# Patient Record
Sex: Female | Born: 1980 | Race: Black or African American | Hispanic: No | Smoking: Former smoker
Health system: Southern US, Community
[De-identification: ages and names within clinical notes are randomized; demographics above are authoritative.]

## PROBLEM LIST (undated history)

## (undated) DIAGNOSIS — F1210 Cannabis abuse, uncomplicated: Secondary | ICD-10-CM

## (undated) DIAGNOSIS — I10 Essential (primary) hypertension: Secondary | ICD-10-CM

## (undated) DIAGNOSIS — F1410 Cocaine abuse, uncomplicated: Secondary | ICD-10-CM

## (undated) DIAGNOSIS — E119 Type 2 diabetes mellitus without complications: Secondary | ICD-10-CM

## (undated) DIAGNOSIS — F1010 Alcohol abuse, uncomplicated: Secondary | ICD-10-CM

## (undated) HISTORY — DX: Essential (primary) hypertension: I10

---

## 2020-03-23 ENCOUNTER — Encounter (HOSPITAL_BASED_OUTPATIENT_CLINIC_OR_DEPARTMENT_OTHER): Payer: Self-pay

## 2020-03-23 ENCOUNTER — Other Ambulatory Visit: Payer: Self-pay

## 2020-03-23 ENCOUNTER — Emergency Department (HOSPITAL_BASED_OUTPATIENT_CLINIC_OR_DEPARTMENT_OTHER)
Admission: EM | Admit: 2020-03-23 | Discharge: 2020-03-23 | Disposition: A | Discharge status: Home / Self Care | Payer: Medicaid Other | Attending: Emergency Medicine | Admitting: Emergency Medicine

## 2020-03-23 DIAGNOSIS — F17210 Nicotine dependence, cigarettes, uncomplicated: Secondary | ICD-10-CM | POA: Diagnosis not present

## 2020-03-23 DIAGNOSIS — R102 Pelvic and perineal pain: Secondary | ICD-10-CM | POA: Diagnosis present

## 2020-03-23 DIAGNOSIS — N898 Other specified noninflammatory disorders of vagina: Secondary | ICD-10-CM | POA: Insufficient documentation

## 2020-03-23 HISTORY — DX: Cannabis abuse, uncomplicated: F12.10

## 2020-03-23 HISTORY — DX: Cocaine abuse, uncomplicated: F14.10

## 2020-03-23 HISTORY — DX: Alcohol abuse, uncomplicated: F10.10

## 2020-03-23 LAB — URINALYSIS, ROUTINE W REFLEX MICROSCOPIC
Bilirubin Urine: NEGATIVE
Glucose, UA: NEGATIVE mg/dL
Hgb urine dipstick: NEGATIVE
Ketones, ur: NEGATIVE mg/dL
Leukocytes,Ua: NEGATIVE
Nitrite: NEGATIVE
Protein, ur: NEGATIVE mg/dL
Specific Gravity, Urine: 1.025 (ref 1.005–1.030)
pH: 7 (ref 5.0–8.0)

## 2020-03-23 LAB — PREGNANCY, URINE: Preg Test, Ur: NEGATIVE

## 2020-03-23 NOTE — ED Notes (Signed)
Patient was having mild vaginal itching earlier, now resolved

## 2020-03-23 NOTE — ED Triage Notes (Addendum)
Pt c/o vaginal pain/irritation x today-states she is at [facility] x today-in rehab for polysubstance abuse-NAD-steady gait

## 2020-03-23 NOTE — ED Provider Notes (Signed)
[facility] EMERGENCY DEPARTMENT Provider Note   CSN: [id number] Arrival date & time: 03/23/20  1447     History Chief Complaint  Patient presents with  . Vaginal Pain    Allison Brown is a 39 y.o. female past [place] polysubstance abuse presents for evaluation of vaginal irritation.  She reports that this morning after drinking orange juice, she had a little bit of vaginal irritation.  She states that is since resolved.  She currently denies any symptoms.  She denies any dysuria, hematuria, vaginal discharge, vaginal bleeding, nausea/vomiting, abdominal pain.  She is currently sexually active with 1 partner.  They do not use protection.  Her LMP was a week ago.  The history is provided by the patient.       Past Medical History:  Diagnosis Date  . Cocaine abuse (HCC)   . ETOH abuse   . Marijuana abuse     There are no problems to display for this patient.    The histories are not reviewed yet. Please review them in the "History" navigator section and refresh this SmartLink.   OB History   No obstetric history on file.     No family history on file.  Social History   Tobacco Use  . Smoking status: Current Every Day Smoker    Types: Cigarettes  . Smokeless tobacco: Never Used  Substance Use Topics  . Alcohol use: Not Currently    Comment: in rehab  . Drug use: Not Currently    Comment: in rehab    Home Medications Prior to Admission medications   Not on File    Allergies    Hydrocodone  Review of Systems   Review of Systems  Constitutional: Negative for fever.  Gastrointestinal: Negative for abdominal pain, nausea and vomiting.  Genitourinary: Negative for pelvic pain, vaginal bleeding, vaginal discharge and vaginal pain.  All other systems reviewed and are negative.   Physical Exam Updated Vital Signs BP (!) 140/104   Pulse 84   Temp 98.8 F (37.1 C) (Oral)   Resp 18   Ht 5\' 3"  (1.6 m)   Wt 86.6 kg   LMP 03/19/2020   SpO2 100%    BMI 33.83 kg/m   Physical Exam Vitals and nursing note reviewed.  Constitutional:      Appearance: She is well-developed.  HENT:     Head: Normocephalic and atraumatic.  Eyes:     General: No scleral icterus.       Right eye: No discharge.        Left eye: No discharge.     Conjunctiva/sclera: Conjunctivae normal.  Pulmonary:     Effort: Pulmonary effort is normal.  Abdominal:     Comments: Abdomen is soft, non-distended, non-tender. No rigidity, No guarding. No peritoneal signs.  Skin:    General: Skin is warm and dry.  Neurological:     Mental Status: She is alert.  Psychiatric:        Speech: Speech normal.        Behavior: Behavior normal.     ED Results / Procedures / Treatments   Labs (all labs ordered are listed, but only abnormal results are displayed) Labs Reviewed  PREGNANCY, URINE  URINALYSIS, ROUTINE W REFLEX MICROSCOPIC    EKG None  Radiology No results found.  Procedures Procedures (including critical care time)  Medications Ordered in ED Medications - No data to display  ED Course  I have reviewed the triage vital signs and the nursing notes.  Pertinent labs & imaging results that were available during my care of the patient were reviewed by me and considered in my medical decision making (see chart for details).    MDM Rules/Calculators/A&P                          39 year old female who presents for evaluation of vaginal irritation.  She reports that after drinking orange juice earlier this morning, she had an episode where she had some vaginal irritation.  She states that is since resolved.  No vaginal pain, vaginal discharge, pelvic pain, abdominal pain, nausea/vomiting.  On initially arrival, she is afebrile, nontoxic-appearing.  Vital signs are stable.  She has a benign abdominal exam.  Urine ordered at triage.  Urine pregnancy is negative.  UA negative for any infectious etiology.  I discussed treatment options with patient in the  ED.  I offered for her to do a self swab, a full pelvic exam with testing, a visual evaluation with nurse chaperone.  After discussing all these options, patient opted to not pursue any further work-up here in the ED.  She states she is not symptomatic sounds like to be discharged.  Patient exhibits full medical decision-making capacity appears clinically sober. At this time, patient exhibits no emergent life-threatening condition that require further evaluation in ED. Patient had ample opportunity for questions and discussion. All patient's questions were answered with full understanding. Strict return precautions discussed. Patient expresses understanding and agreement to plan.   Portions of this note were generated with Scientist, clinical (histocompatibility and immunogenetics). Dictation errors may occur despite best attempts at proofreading.   Final Clinical Impression(s) / ED Diagnoses Final diagnoses:  Vaginal irritation    Rx / DC Orders ED Discharge Orders    None       Rosana Hoes 03/23/20 1834    Pollyann Savoy, MD 03/23/20 1840

## 2020-03-23 NOTE — Discharge Instructions (Signed)
Return to the Emergency Dept for any vaginal bleeding, vaginal pain, abdominal pain, fever, vomiting.

## 2020-04-29 ENCOUNTER — Other Ambulatory Visit: Payer: Self-pay

## 2020-04-29 ENCOUNTER — Telehealth (INDEPENDENT_AMBULATORY_CARE_PROVIDER_SITE_OTHER): Payer: Medicaid Other | Admitting: Physician Assistant

## 2020-04-29 DIAGNOSIS — F411 Generalized anxiety disorder: Secondary | ICD-10-CM | POA: Diagnosis not present

## 2020-04-29 DIAGNOSIS — F322 Major depressive disorder, single episode, severe without psychotic features: Secondary | ICD-10-CM | POA: Insufficient documentation

## 2020-04-29 DIAGNOSIS — F32A Depression, unspecified: Secondary | ICD-10-CM | POA: Insufficient documentation

## 2020-04-29 DIAGNOSIS — F331 Major depressive disorder, recurrent, moderate: Secondary | ICD-10-CM

## 2020-04-29 MED ORDER — HYDROXYZINE HCL 10 MG PO TABS: 10.0000 mg | ORAL_TABLET | Freq: Three times a day (TID) | ORAL | 0 refills | Status: DC | PRN

## 2020-04-29 MED ORDER — FLUOXETINE HCL 20 MG PO CAPS: 20.0000 mg | ORAL_CAPSULE | Freq: Every day | ORAL | 1 refills | Status: DC
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
## 2020-05-02 ENCOUNTER — Telehealth (HOSPITAL_COMMUNITY): Payer: Medicaid Other

## 2020-05-02 ENCOUNTER — Encounter (HOSPITAL_COMMUNITY): Payer: Self-pay | Admitting: Physician Assistant

## 2020-05-02 ENCOUNTER — Other Ambulatory Visit: Payer: Self-pay

## 2020-05-02 ENCOUNTER — Ambulatory Visit (HOSPITAL_COMMUNITY): Payer: Self-pay | Admitting: Licensed Clinical Social Worker

## 2020-06-10 ENCOUNTER — Telehealth (INDEPENDENT_AMBULATORY_CARE_PROVIDER_SITE_OTHER): Payer: Medicaid Other | Admitting: Physician Assistant

## 2020-06-10 ENCOUNTER — Other Ambulatory Visit: Payer: Self-pay

## 2020-06-10 ENCOUNTER — Encounter (HOSPITAL_COMMUNITY): Payer: Self-pay | Admitting: Physician Assistant

## 2020-06-10 DIAGNOSIS — F331 Major depressive disorder, recurrent, moderate: Secondary | ICD-10-CM | POA: Diagnosis not present

## 2020-06-10 DIAGNOSIS — F411 Generalized anxiety disorder: Secondary | ICD-10-CM | POA: Diagnosis not present

## 2020-06-10 MED ORDER — FLUOXETINE HCL 20 MG PO CAPS: 20.0000 mg | ORAL_CAPSULE | Freq: Every day | ORAL | 1 refills | Status: DC

## 2020-06-10 MED ORDER — HYDROXYZINE HCL 10 MG PO TABS: 10.0000 mg | ORAL_TABLET | Freq: Three times a day (TID) | ORAL | 1 refills | Status: DC | PRN

## 2020-06-10 NOTE — Progress Notes (Signed)
BH MD/PA/NP OP Progress Note  Virtual Visit via Telephone Note  I connected with Allison Brown on 06/10/20 at  3:00 PM EST by telephone and verified that I am speaking with the correct person using two identifiers.  Location: Patient: Home Provider: Clinic   I discussed the limitations, risks, security and privacy concerns of performing an evaluation and management service by telephone and the availability of in person appointments. I also discussed with the patient that there may be a patient responsible charge related to this service. The patient expressed understanding and agreed to proceed.  Follow Up Instructions:   I discussed the assessment and treatment plan with the patient. The patient was provided an opportunity to ask questions and all were answered. The patient agreed with the plan and demonstrated an understanding of the instructions.   The patient was advised to call back or seek an in-person evaluation if the symptoms worsen or if the condition fails to improve as anticipated.  I provided 22 minutes of non-face-to-face time during this encounter.  Allison Hatchet, PA   06/10/2020 3:25 PM Allison Brown  MRN:  [id number]  Chief Complaint: Follow-up and medication management  HPI:  Allison Brown is a 40 year old female with a past psychiatric history significant for generalized anxiety disorder and major depressive disorder who presents to [facility] for follow-up and medication management.  Patient is currently being managed on the following medications:  Hydroxyzine 10 mg 3 times daily as needed Prozac 20 mg daily  Patient reports no issues or concerns with her current medication regimen.  Patient denies experiencing any side effects from her current medications.  Patient denies the need for dosage adjustments at this time and states that her symptoms are well controlled.  Patient is requesting refills on her medications  following the conclusion of the encounter.  Patient rates her current anxiety a 6 out of 10.  A GAD-7 screen was performed with the patient scoring a one.  Patient is pleasant, calm, cooperative, and fully engaged in conversation during the encounter.  Patient reports that she is relaxed and genial.  Patient denies suicidal or homicidal ideations.  She further denies auditory or visual hallucinations.  Patient endorses good sleep and receives on average 8 hours of sleep each night.  Patient endorses good appetite and eats on average 3 meals per day.  Patient denies alcohol consumption, tobacco use, and illicit drug use.  Visit Diagnosis: No diagnosis found.  Past Psychiatric History:  Major depressive disorder Generalized anxiety disorder  Past Medical History:  Past Medical History:  Diagnosis Date  . Cocaine abuse (HCC)   . ETOH abuse   . Marijuana abuse    History reviewed. No pertinent surgical history.  Family Psychiatric History: Grandmother - Anxiety and depression  Family History: History reviewed. No pertinent family history.  Social History:  Social History   Socioeconomic History  . Marital status: Legally Separated    Spouse name: Not on file  . Number of children: Not on file  . Years of education: Not on file  . Highest education level: Not on file  Occupational History  . Not on file  Tobacco Use  . Smoking status: Current Every Day Smoker    Types: Cigarettes  . Smokeless tobacco: Never Used  Substance and Sexual Activity  . Alcohol use: Not Currently    Comment: in rehab  . Drug use: Not Currently    Comment: in rehab  . Sexual activity: Not  on file  Other Topics Concern  . Not on file  Social History Narrative  . Not on file   Social Determinants of Health   Financial Resource Strain: Not on file  Food Insecurity: Not on file  Transportation Needs: Not on file  Physical Activity: Not on file  Stress: Not on file  Social Connections: Not on  file    Allergies:  Allergies  Allergen Reactions  . Hydrocodone Itching    Metabolic Disorder Labs: No results found for: HGBA1C, MPG No results found for: PROLACTIN No results found for: CHOL, TRIG, HDL, CHOLHDL, VLDL, LDLCALC No results found for: TSH  Therapeutic Level Labs: No results found for: LITHIUM No results found for: VALPROATE No components found for:  CBMZ  Current Medications: Current Outpatient Medications  Medication Sig Dispense Refill  . FLUoxetine (PROZAC) 20 MG capsule Take 1 capsule (20 mg total) by mouth daily. 30 capsule 1  . hydrOXYzine (ATARAX/VISTARIL) 10 MG tablet Take 1 tablet (10 mg total) by mouth 3 (three) times daily as needed for anxiety. 90 tablet 0   No current facility-administered medications for this visit.     Musculoskeletal: Strength & Muscle Tone: Unable to assess due to telemedicine visit Gait & Station: Unable to assess due to telemedicine visit Patient leans: Unable to assess due to telemedicine visit  Psychiatric Specialty Exam: Review of Systems  Psychiatric/Behavioral: Negative for decreased concentration, dysphoric mood, hallucinations, self-injury, sleep disturbance and suicidal ideas. The patient is nervous/anxious. The patient is not hyperactive.     There were no vitals taken for this visit.There is no height or weight on file to calculate BMI.  General Appearance: Unable to assess due to telemedicine visit  Eye Contact:  Unable to assess due to telemedicine visit  Speech:  Clear and Coherent and Normal Rate  Volume:  Normal  Mood:  Anxious and Euthymic  Affect:  Appropriate and Congruent  Thought Process:  Coherent, Goal Directed and Descriptions of Associations: Intact  Orientation:  Full (Time, Place, and Person)  Thought Content: WDL and Logical   Suicidal Thoughts:  No  Homicidal Thoughts:  No  Memory:  Immediate;   Good Recent;   Good Remote;   Good  Judgement:  Good  Insight:  Fair  Psychomotor  Activity:  Normal  Concentration:  Concentration: Good and Attention Span: Good  Recall:  Good  Fund of Knowledge: Good  Language: Good  Akathisia:  NA  Handed:  Right  AIMS (if indicated): not done  Assets:  Communication Skills Desire for Improvement Housing  ADL's:  Intact  Cognition: WNL  Sleep:  Good   Screenings: GAD-7   Flowsheet Row Video Visit from 06/10/2020 in [facility] Video Visit from 04/29/2020 in [facility]  Total GAD-7 Score 1 11    PHQ2-9   Flowsheet Row Video Visit from 06/10/2020 in [facility] Video Visit from 04/29/2020 in [facility]  PHQ-2 Total Score 0 6  PHQ-9 Total Score -- 18    Flowsheet Row Video Visit from 06/10/2020 in [facility]  C-SSRS RISK CATEGORY No Risk      Assessment and Plan:   Allison Brown is a 40 year old female with a past psychiatric history significant for generalized anxiety disorder and major depressive disorder who presents to [facility] for follow-up and medication management.  Patient reports no issues or concerns with her current medication regimen.  Patient denies the need for dosage adjustments at this time and states that her symptoms are well managed.  Patient is requesting refills on her medications following the conclusion of the encounter.  Patient's medications will be e-prescribed to pharmacy of choice.  1. Generalized anxiety disorder  - hydrOXYzine (ATARAX/VISTARIL) 10 MG tablet; Take 1 tablet (10 mg total) by mouth 3 (three) times daily as needed for anxiety.  Dispense: 90 tablet; Refill: 1 - FLUoxetine (PROZAC) 20 MG capsule; Take 1 capsule (20 mg total) by mouth daily.  Dispense: 30 capsule; Refill: 1  2. Moderate episode of recurrent major depressive disorder (HCC)  - FLUoxetine (PROZAC) 20 MG capsule; Take 1 capsule (20 mg  total) by mouth daily.  Dispense: 30 capsule; Refill: 1  Patient to follow up in 6 weeks  Allison Hatchet, PA 06/10/2020, 3:25 PM

## 2020-06-13 ENCOUNTER — Encounter: Payer: Medicaid Other | Admitting: Obstetrics and Gynecology

## 2020-06-14 ENCOUNTER — Telehealth: Payer: Self-pay

## 2020-06-14 NOTE — Telephone Encounter (Signed)
Pt needs a call to reschedule her appointment

## 2020-06-14 NOTE — Telephone Encounter (Signed)
Patient has been rescheduled for 06/17/20

## 2020-06-17 ENCOUNTER — Encounter: Payer: Medicaid Other | Admitting: Obstetrics and Gynecology

## 2020-06-20 ENCOUNTER — Other Ambulatory Visit (HOSPITAL_COMMUNITY)
Admission: RE | Admit: 2020-06-20 | Discharge: 2020-06-20 | Disposition: A | Discharge status: Home / Self Care | Payer: Medicaid Other | Source: Ambulatory Visit | Attending: Obstetrics and Gynecology | Admitting: Obstetrics and Gynecology

## 2020-06-20 ENCOUNTER — Ambulatory Visit (INDEPENDENT_AMBULATORY_CARE_PROVIDER_SITE_OTHER): Payer: Medicaid Other | Admitting: Obstetrics and Gynecology

## 2020-06-20 ENCOUNTER — Encounter: Payer: Self-pay | Admitting: Obstetrics and Gynecology

## 2020-06-20 ENCOUNTER — Other Ambulatory Visit: Payer: Self-pay

## 2020-06-20 VITALS — BP 138/90 | Ht 63.0 in | Wt 197.0 lb

## 2020-06-20 DIAGNOSIS — Z0000 Encounter for general adult medical examination without abnormal findings: Secondary | ICD-10-CM | POA: Diagnosis not present

## 2020-06-20 DIAGNOSIS — Z124 Encounter for screening for malignant neoplasm of cervix: Secondary | ICD-10-CM | POA: Insufficient documentation

## 2020-06-20 DIAGNOSIS — Z3009 Encounter for other general counseling and advice on contraception: Secondary | ICD-10-CM

## 2020-06-20 DIAGNOSIS — Z7185 Encounter for immunization safety counseling: Secondary | ICD-10-CM | POA: Diagnosis not present

## 2020-06-20 DIAGNOSIS — Z30015 Encounter for initial prescription of vaginal ring hormonal contraceptive: Secondary | ICD-10-CM

## 2020-06-20 DIAGNOSIS — Z113 Encounter for screening for infections with a predominantly sexual mode of transmission: Secondary | ICD-10-CM | POA: Insufficient documentation

## 2020-06-20 DIAGNOSIS — Z1159 Encounter for screening for other viral diseases: Secondary | ICD-10-CM

## 2020-06-20 DIAGNOSIS — Z01419 Encounter for gynecological examination (general) (routine) without abnormal findings: Secondary | ICD-10-CM

## 2020-06-20 MED ORDER — ETONOGESTREL-ETHINYL ESTRADIOL 0.12-0.015 MG/24HR VA RING: VAGINAL_RING | VAGINAL | 4 refills | Status: DC

## 2020-06-20 NOTE — Progress Notes (Signed)
Gynecology Annual Exam   PCP: Allison Brown, No Pcp Per  Chief Complaint:  Chief Complaint  Allison Brown presents with   Annual Exam    History of Present Illness: Allison Brown is a 40 y.o. [name] presents for annual exam. The Allison Brown has no complaints today.   LMP: Allison Brown's last menstrual period was 06/14/2020. Average Interval: regular, 28 days Duration of flow: 5 days Heavy Menses: yes Clots: no Intermenstrual Bleeding: no Postcoital Bleeding: no Dysmenorrhea: yes  The Allison Brown is sexually active. She currently uses nothing for contraception. She denies dyspareunia.  There is no notable family history of breast or ovarian cancer in her family.  The Allison Brown wears seatbelts: yes.   The Allison Brown has regular exercise: yes - she reports an "active" lifestyle  The Allison Brown denies current symptoms of depression.    Review of Systems: Review of Systems  Constitutional: Negative.   HENT: Negative.   Eyes: Negative.   Respiratory: Negative.   Cardiovascular: Negative.   Gastrointestinal: Negative.   Genitourinary: Negative.   Musculoskeletal: Negative.   Skin: Negative.   Neurological: Negative.   Endo/Heme/Allergies: Negative.     Past Medical History:  Allison Brown Active Problem List   Diagnosis Date Noted   Generalized anxiety disorder 04/29/2020   Moderately severe depression (HCC) 04/29/2020   Moderate episode of recurrent major depressive disorder (HCC) 04/29/2020    Past Surgical History:  Past Surgical History:  Procedure Laterality Date   CESAREAN SECTION      Gynecologic History:  Allison Brown's last menstrual period was 06/14/2020. Contraception: none Last Pap: Allison Brown is unsure  Obstetric History: [name]  Family History:  History reviewed. No pertinent family history.  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 on file  Substance and Sexual Activity   Alcohol use: Not Currently    Comment: in rehab   Drug use: Not Currently    Comment: in rehab   Sexual activity: Yes    Birth control/protection: Condom, None  Other Topics Concern   Not on file  Social History Narrative   Not on file   Social Determinants of Health   Financial Resource Strain: Not on file  Food Insecurity: Not on file  Transportation Needs: Not on file  Physical Activity: Not on file  Stress: Not on file  Social Connections: Not on file  Intimate Partner Violence: Not on file    Allergies:  Allergies  Allergen Reactions   Hydrocodone Itching    Medications: Prior to Admission medications   Medication Sig Start Date End Date Taking? Authorizing Provider  etonogestrel-ethinyl estradiol (NUVARING) 0.12-0.015 MG/24HR vaginal ring Insert vaginally and leave in place for 3 consecutive weeks, then remove for 1 week. 06/20/20  Yes Carnelius Hammitt, CNM  FLUoxetine (PROZAC) 20 MG capsule Take 1 capsule (20 mg total) by mouth daily. 06/10/20 06/10/21 Yes Nwoko, Uchenna E, PA  hydrOXYzine (ATARAX/VISTARIL) 10 MG tablet Take 1 tablet (10 mg total) by mouth 3 (three) times daily as needed for anxiety. 06/10/20  Yes Meta Hatchet, PA    Physical Exam Vitals:  Blood pressure 138/90, height 5\' 3"  (1.6 m), weight 197 lb (89.4 kg), last menstrual period 06/14/2020.  General: NAD HEENT: normocephalic, anicteric Thyroid: no enlargement, no palpable nodules Pulmonary: No increased work of breathing, CTAB Cardiovascular: RRR, distal pulses 2+ Breast: Breast symmetrical, no tenderness, no palpable nodules or masses, no skin or nipple retraction present, no nipple discharge.  No axillary or supraclavicular  lymphadenopathy. Abdomen: NABS, soft, non-tender, non-distended.  Umbilicus without lesions.  No hepatomegaly, splenomegaly or masses palpable. No evidence of hernia  Genitourinary:  External: Normal external female genitalia.  Normal urethral meatus, normal Bartholin's and Skene's glands.    Vagina: Normal vaginal mucosa, no evidence of prolapse.  Small amount of blood noted in vaginal vault. Of note - during exam several cotton balls (8-10) and two tampons were removed from vaginal vault.   Cervix: Grossly normal in appearance, no bleeding  Uterus: Non-enlarged, mobile, normal contour.  No CMT  Adnexa: ovaries non-enlarged, no adnexal masses  Rectal: deferred  Lymphatic: no evidence of inguinal lymphadenopathy Extremities: no edema, erythema, or tenderness Neurologic: Grossly intact Psychiatric: mood appropriate, affect full  Assessment: 40 y.o. 20 routine annual exam, desires contraception.  Plan: Problem List Items Addressed This Visit   None   Visit Diagnoses    Screening for cervical cancer    -  Primary   Relevant Orders   Cytology - PAP (Completed)   Screen for sexually transmitted diseases       Relevant Orders   Cytology - PAP (Completed)   HIV Antibody (routine testing w rflx) (Completed)   RPR Qual (Completed)   Hepatitis B surface antigen (Completed)   Immunization counseling       Routine health maintenance       Encounter for gynecological examination without abnormal finding       Encounter for counseling regarding contraception       Encounter for initial prescription of vaginal ring hormonal contraceptive       Need for hepatitis C screening test       Relevant Orders   Hepatitis C antibody (Completed)      1) STI screening  wasoffered and accepted  2)  ASCCP guidelines and rational discussed.  Allison Brown opts for every 3 years screening interval  3) Contraception - the Allison Brown is currently using nothing.  Allison Brown desires prescription for contraception  today. All contraception options were reviewed including IUDs, implants, injections, and COCs. Allison Brown desires to proceed with vaginal ring at this time.   4) Routine healthcare maintenance including cholesterol, diabetes screening discussed Allison Brown scheduled to establish care with a PCP - will follow-up regarding these labs at that visit  5) Return in about 1 year (around 06/20/2021).   08/20/2021, CNM, MSN [facility], [facility] 06/23/2020, 10:59 AM

## 2020-06-21 LAB — RPR QUALITATIVE: RPR Ser Ql: NONREACTIVE

## 2020-06-21 LAB — HIV ANTIBODY (ROUTINE TESTING W REFLEX): HIV Screen 4th Generation wRfx: NONREACTIVE

## 2020-06-21 LAB — CYTOLOGY - PAP
Adequacy: ABSENT
Chlamydia: NEGATIVE
Comment: NEGATIVE
Comment: NEGATIVE
Comment: NORMAL
Diagnosis: NEGATIVE
High risk HPV: NEGATIVE
Neisseria Gonorrhea: NEGATIVE

## 2020-06-21 LAB — HEPATITIS C ANTIBODY: Hep C Virus Ab: <0.1 s/co ratio (ref 0.0–0.9)

## 2020-06-21 LAB — HEPATITIS B SURFACE ANTIGEN: Hepatitis B Surface Ag: NEGATIVE

## 2020-06-23 ENCOUNTER — Other Ambulatory Visit: Payer: Self-pay | Admitting: Obstetrics and Gynecology

## 2020-06-23 DIAGNOSIS — Z30013 Encounter for initial prescription of injectable contraceptive: Secondary | ICD-10-CM

## 2020-06-23 MED ORDER — MEDROXYPROGESTERONE ACETATE 150 MG/ML IM SUSP: 150.0000 mg | INTRAMUSCULAR | 3 refills | Status: AC

## 2020-06-23 NOTE — Progress Notes (Signed)
Spoke with patient via phone. Utilized two patient identifiers. Reviewed labs collected on 06/20/20. All within normal limits. Discussed review of patient's chart and history of elevated blood pressures. Discussed prescription of vaginal ring COC for contraception and risks related to hx of elevated blood pressures. Made recommendation to change contraception. Patient desires depo injection. Ordered. Patient instructed to fill rx and bring to office for injection.  Zipporah Plants, CNM, MSN  [place], [place] 06/23/2020 08:57 AM

## 2020-06-28 ENCOUNTER — Encounter: Payer: Self-pay | Admitting: Family Medicine

## 2020-06-28 ENCOUNTER — Other Ambulatory Visit: Payer: Self-pay

## 2020-06-28 ENCOUNTER — Ambulatory Visit (INDEPENDENT_AMBULATORY_CARE_PROVIDER_SITE_OTHER): Payer: Medicaid Other | Admitting: Family Medicine

## 2020-06-28 ENCOUNTER — Ambulatory Visit: Payer: Self-pay | Admitting: Family Medicine

## 2020-06-28 ENCOUNTER — Telehealth: Payer: Self-pay

## 2020-06-28 ENCOUNTER — Ambulatory Visit
Admission: RE | Admit: 2020-06-28 | Discharge: 2020-06-28 | Disposition: A | Discharge status: Home / Self Care | Payer: Medicaid Other | Attending: Family Medicine | Admitting: Family Medicine

## 2020-06-28 ENCOUNTER — Ambulatory Visit
Admission: RE | Admit: 2020-06-28 | Discharge: 2020-06-28 | Disposition: A | Discharge status: Home / Self Care | Payer: Medicaid Other | Source: Ambulatory Visit | Attending: Family Medicine | Admitting: Family Medicine

## 2020-06-28 VITALS — BP 142/100 | HR 72 | Temp 97.8°F | Ht 63.0 in | Wt 199.0 lb

## 2020-06-28 DIAGNOSIS — Z6835 Body mass index (BMI) 35.0-35.9, adult: Secondary | ICD-10-CM

## 2020-06-28 DIAGNOSIS — M542 Cervicalgia: Secondary | ICD-10-CM

## 2020-06-28 DIAGNOSIS — H6121 Impacted cerumen, right ear: Secondary | ICD-10-CM | POA: Diagnosis not present

## 2020-06-28 DIAGNOSIS — F411 Generalized anxiety disorder: Secondary | ICD-10-CM

## 2020-06-28 DIAGNOSIS — R202 Paresthesia of skin: Secondary | ICD-10-CM

## 2020-06-28 DIAGNOSIS — F32A Depression, unspecified: Secondary | ICD-10-CM

## 2020-06-28 DIAGNOSIS — R030 Elevated blood-pressure reading, without diagnosis of hypertension: Secondary | ICD-10-CM | POA: Diagnosis not present

## 2020-06-28 DIAGNOSIS — Z0000 Encounter for general adult medical examination without abnormal findings: Secondary | ICD-10-CM

## 2020-06-28 DIAGNOSIS — F322 Major depressive disorder, single episode, severe without psychotic features: Secondary | ICD-10-CM

## 2020-06-28 NOTE — Assessment & Plan Note (Signed)
Patient had recently been seen by OB/GYN who she chooses to follow-up for related needs, they have also ordered appropriate studies. Remaining pertinent labs were ordered and will be reviewed at her follow-up

## 2020-06-28 NOTE — Telephone Encounter (Signed)
Copied from CRM [id number]. Topic: General - Other >> Jun 28, 2020 12:18 PM Gaetana Michaelis A wrote: Reason for CRM: Patient has been notified by staff of additional labwork needed  Patient would like to be contacted to discuss further, as well as alternative locations of testing   Please contact to advise further

## 2020-06-28 NOTE — Assessment & Plan Note (Signed)
Focal symptoms which were addressed with lavage providing prompt resolution of symptoms expressed by patient. Routine care advised, she can follow-up as-needed for this if recurrent.

## 2020-06-28 NOTE — Assessment & Plan Note (Signed)
Appropriate risk stratification labs ordered, importance of diet, exercise reviewed. We will review labs and modify plan at return.

## 2020-06-28 NOTE — Assessment & Plan Note (Signed)
Patient is stable on her current regimen and sees outside mental health group who she is comfortable with to manage this issue longitudinally

## 2020-06-28 NOTE — Assessment & Plan Note (Signed)
Patient with elevated blood pressure on serial measurement today. I discussed pharmacotherapy and she is not amenable to initiation today, we will order risk stratification labs and I have advised her on DASH diet, exercise, and further lifestyle modifications over the interim.   Pending labs and BP reading at return, can revisit pharmacotherapy options.

## 2020-06-28 NOTE — Telephone Encounter (Signed)
Spoke to pt let her know that she could go to any [facility]. Told pt to bring her I.D when she goes to get her labs done. Pt verbalized understanding.  KP

## 2020-06-28 NOTE — Telephone Encounter (Signed)
Erroneous

## 2020-06-28 NOTE — Progress Notes (Signed)
New Patient Office Visit  Subjective:  Patient ID: Allison Brown, female    DOB: 12-27-1980  Age: 40 y.o. MRN: [id number]  CC:  Chief Complaint  Patient presents with  . Establish Care  . Neck Pain    HPI Allison Brown presents for an establishment of care visit as well as to address recent issues.  She states that she has had a few days history of lower neck/upper back pain described as an ache with radiation circumferentially as well as associated intermittent paresthesias in her right upper extremity to the fingertips, denies weakness, no trauma and woke up with the symptoms yesterday.  These were exacerbated by neck and upper body motion, partially alleviated with Excedrin, time, and states that symptoms are still present though milder today. Denies chest pain, palpitations, SOA, trauma, or prior episodes of the same.   She also brings up right ear pain for a few weeks, feels that there is "water on the ear" and has tried OTC Debrox without response, no fevers, chills, sick contacts, drainage.   Past Medical History:  Diagnosis Date  . Cocaine abuse (HCC)   . ETOH abuse   . Hypertension   . Marijuana abuse     Past Surgical History:  Procedure Laterality Date  . CESAREAN SECTION      History reviewed. No pertinent family history.  Social History   Socioeconomic History  . Marital status: Legally Separated    Spouse name: Not on file  . Number of children: 4  . Years of education: Not on file  . Highest education level: Not on file  Occupational History  . Not on file  Tobacco Use  . Smoking status: Former Smoker    Types: Cigarettes    Quit date: 03/20/2020    Years since quitting: 0.2  . Smokeless tobacco: Never Used  Vaping Use  . Vaping Use: Never used  Substance and Sexual Activity  . Alcohol use: Not Currently    Comment: in rehab  . Drug use: Not Currently    Comment: in rehab  . Sexual activity: Yes    Birth control/protection: Condom, None   Other Topics Concern  . Not on file  Social History Narrative  . Not on file   Social Determinants of Health   Financial Resource Strain: Not on file  Food Insecurity: Not on file  Transportation Needs: Not on file  Physical Activity: Not on file  Stress: Not on file  Social Connections: Not on file  Intimate Partner Violence: Not on file    ROS Review of Systems  Constitutional: Negative for chills and fever.  HENT: Positive for ear pain. Negative for ear discharge and rhinorrhea.   Respiratory: Negative for cough, chest tightness and shortness of breath.   Cardiovascular: Negative for chest pain and palpitations.  Gastrointestinal: Negative for abdominal pain.  Musculoskeletal: Positive for neck pain and neck stiffness.  Skin: Negative for rash.  Neurological: Positive for numbness. Negative for headaches.    Objective:   Today's Vitals: BP (!) 142/100   Pulse 72   Temp 97.8 F (36.6 C) (Oral)   Ht 5\' 3"  (1.6 m)   Wt 199 lb (90.3 kg)   LMP 06/14/2020   SpO2 99%   BMI 35.25 kg/m   Physical Exam  General: Well Developed, well nourished, and in no acute distress.  Neuro: Alert and oriented x3, extra-ocular muscles intact, sensation grossly intact. Cranial nerves II through XII are intact, motor, sensory, and coordinative functions  are all intact. HEENT: Normocephalic, atraumatic, pupils equal round reactive to light, neck supple, no masses, no lymphadenopathy, thyroid nonpalpable. Oropharynx, nasopharynx, external ear canals are unremarkable. Left TM benign, Right TM with dark impacted cerumen, canal benign; post-irrigation TM visualized and benign Skin: Warm and dry, no rashes noted.  Cardiac: Regular rate and rhythm, no murmurs rubs or gallops.  Respiratory: Clear to auscultation bilaterally. Not using accessory muscles, speaking in full sentences.  Abdominal: Soft, nontender, nondistended, positive bowel sounds, no masses, no organomegaly.  Musculoskeletal:  Shoulder, elbow, wrist, hip, knee, ankle stable, and with full range of motion.  Neck: Inspection unremarkable. No palpable stepoffs. Equivocal Spurling's maneuver Right. Full neck range of motion, painful rightward torsion and extension Grip strength and sensation grossly normal in bilateral hands Strength good C4 to T1 distribution No sensory change to C4 to T1 Negative Hoffman sign bilaterally Reflexes normal  Procedure: Cerumen lavage Indication: Cerumen impaction of the ear(s)  Medical necessity statement: On physical examination, cerumen impairs clinically significant portions of the external auditory canal, and tympanic membrane. Noted obstructive, copious cerumen that cannot be removed without magnification and instrumentations requiring physician skills Consent: Discussed benefits and risks of procedure and verbal consent obtained Procedure: Patient was prepped for the procedure. Utilized an otoscope to assess and take note of the ear canal, the tympanic membrane, and the presence, amount, and placement of the cerumen. Gentle water irrigation was utilized to remove cerumen.  Post procedure examination: shows cerumen was completely removed. Patient tolerated procedure well with endorsed significant symptomatologic improvement. The patient is made aware that they may experience temporary vertigo, temporary hearing loss, and temporary discomfort. If these symptom last for more than 24 hours to call the clinic or proceed to the ED.   Assessment & Plan:   Problem List Items Addressed This Visit      Nervous and Auditory   Impacted cerumen of right ear    Focal symptoms which were addressed with lavage providing prompt resolution of symptoms expressed by patient. Routine care advised, she can follow-up as-needed for this if recurrent.         Other   Moderately severe depression (HCC)    Patient is stable on her current regimen and sees outside mental health group who she is  comfortable with to manage this issue longitudinally      Routine general medical examination at a health care facility    Patient had recently been seen by OB/GYN who she chooses to follow-up for related needs, they have also ordered appropriate studies. Remaining pertinent labs were ordered and will be reviewed at her follow-up      Relevant Orders   CBC With Differential   Comprehensive metabolic panel   Paresthesia of right upper extremity    Atraumatic cervicalgia with associated right upper extremity paresthesias, examination overall reassuring. Primary symptoms upper trapezius though due to neurologic involvement, cervical spine will be further evaluated with plain films. I have also advised moist heat, gentle activity, and PRN Pennsaid twice daily to clean and dry skin (sample provided).  Pending x-rays and response to treatments, if suboptimal, can escalate pharmacotherapy, consider home-based versus formal PT as appropriate.      Relevant Orders   DG Cervical Spine Complete   Cervicalgia   Relevant Orders   DG Cervical Spine Complete   Elevated blood pressure reading    Patient with elevated blood pressure on serial measurement today. I discussed pharmacotherapy and she is not amenable to initiation today, we  will order risk stratification labs and I have advised her on DASH diet, exercise, and further lifestyle modifications over the interim.   Pending labs and BP reading at return, can revisit pharmacotherapy options.      Body mass index (BMI) of 35.0-35.9 in adult - Primary    Appropriate risk stratification labs ordered, importance of diet, exercise reviewed. We will review labs and modify plan at return.       Relevant Orders   Hemoglobin A1c   Lipid panel   TSH      Outpatient Encounter Medications as of 06/28/2020  Medication Sig  . FLUoxetine (PROZAC) 20 MG capsule Take 1 capsule (20 mg total) by mouth daily.  . hydrOXYzine (ATARAX/VISTARIL) 10 MG tablet  Take 1 tablet (10 mg total) by mouth 3 (three) times daily as needed for anxiety.  . medroxyPROGESTERone (DEPO-PROVERA) 150 MG/ML injection Inject 1 mL (150 mg total) into the muscle every 3 (three) months.   No facility-administered encounter medications on file as of 06/28/2020.    Follow-up: Return in about 2 weeks (around 07/12/2020) for f/u neck pain, labs.   Jerrol Banana, MD

## 2020-06-28 NOTE — Assessment & Plan Note (Signed)
Atraumatic cervicalgia with associated right upper extremity paresthesias, examination overall reassuring. Primary symptoms upper trapezius though due to neurologic involvement, cervical spine will be further evaluated with plain films. I have also advised moist heat, gentle activity, and PRN Pennsaid twice daily to clean and dry skin (sample provided).  Pending x-rays and response to treatments, if suboptimal, can escalate pharmacotherapy, consider home-based versus formal PT as appropriate.

## 2020-06-29 ENCOUNTER — Telehealth: Payer: Self-pay

## 2020-06-29 LAB — COMPREHENSIVE METABOLIC PANEL
ALT: 16 IU/L (ref 0–32)
AST: 18 IU/L (ref 0–40)
Albumin/Globulin Ratio: 1.6 (ref 1.2–2.2)
Albumin: 4.1 g/dL (ref 3.8–4.8)
Alkaline Phosphatase: 99 IU/L (ref 44–121)
BUN/Creatinine Ratio: 8 — ABNORMAL LOW (ref 9–23)
BUN: 6 mg/dL (ref 6–20)
Bilirubin Total: <0.2 mg/dL (ref 0.0–1.2)
CO2: 21 mmol/L (ref 20–29)
Calcium: 9.1 mg/dL (ref 8.7–10.2)
Chloride: 103 mmol/L (ref 96–106)
Creatinine, Ser: 0.71 mg/dL (ref 0.57–1.00)
Globulin, Total: 2.6 g/dL (ref 1.5–4.5)
Glucose: 106 mg/dL — ABNORMAL HIGH (ref 65–99)
Potassium: 4.3 mmol/L (ref 3.5–5.2)
Sodium: 138 mmol/L (ref 134–144)
Total Protein: 6.7 g/dL (ref 6.0–8.5)
eGFR: 112 mL/min/{1.73_m2} (ref >59)

## 2020-06-29 LAB — CBC WITH DIFFERENTIAL
Basophils Absolute: 0.1 10*3/uL (ref 0.0–0.2)
Basos: 1 %
EOS (ABSOLUTE): 0.1 10*3/uL (ref 0.0–0.4)
Eos: 1 %
Hematocrit: 37.7 % (ref 34.0–46.6)
Hemoglobin: 12.3 g/dL (ref 11.1–15.9)
Immature Grans (Abs): 0 10*3/uL (ref 0.0–0.1)
Immature Granulocytes: 0 %
Lymphocytes Absolute: 2.7 10*3/uL (ref 0.7–3.1)
Lymphs: 36 %
MCH: 27.3 pg (ref 26.6–33.0)
MCHC: 32.6 g/dL (ref 31.5–35.7)
MCV: 84 fL (ref 79–97)
Monocytes Absolute: 0.7 10*3/uL (ref 0.1–0.9)
Monocytes: 9 %
Neutrophils Absolute: 3.8 10*3/uL (ref 1.4–7.0)
Neutrophils: 53 %
RBC: 4.51 x10E6/uL (ref 3.77–5.28)
RDW: 13.1 % (ref 11.7–15.4)
WBC: 7.3 10*3/uL (ref 3.4–10.8)

## 2020-06-29 NOTE — Telephone Encounter (Signed)
Patient is scheduled for 07/01/20

## 2020-06-29 NOTE — Telephone Encounter (Signed)
-----   Message from Zipporah Plants, [place] sent at 06/23/2020  8:59 AM EST ----- Regarding: Depo Nurse Visit Could we call and get this patient scheduled for a nurse visit for her depo? Thanks, Jae Dire

## 2020-07-01 ENCOUNTER — Ambulatory Visit: Payer: Medicaid Other

## 2020-07-04 ENCOUNTER — Telehealth: Payer: Self-pay | Admitting: Family Medicine

## 2020-07-04 NOTE — Telephone Encounter (Signed)
Please give a call to the patient to let her know she still needs to go get a few more labs that are pending. Thanks

## 2020-07-04 NOTE — Telephone Encounter (Signed)
Spoke with patient who states she was unaware there were labs still pending to be collected.  Per patient will come in [place] for fasting labs.  For your information.

## 2020-07-05 ENCOUNTER — Other Ambulatory Visit: Payer: Self-pay

## 2020-07-05 ENCOUNTER — Ambulatory Visit (INDEPENDENT_AMBULATORY_CARE_PROVIDER_SITE_OTHER): Payer: Medicaid Other

## 2020-07-05 DIAGNOSIS — Z3042 Encounter for surveillance of injectable contraceptive: Secondary | ICD-10-CM | POA: Diagnosis not present

## 2020-07-05 MED ORDER — MEDROXYPROGESTERONE ACETATE 150 MG/ML IM SUSP
150.0000 mg | Freq: Once | INTRAMUSCULAR | Status: AC
  Administered 2020-07-05: 150 mg via INTRAMUSCULAR

## 2020-07-05 NOTE — Progress Notes (Signed)
Patient presents today for initial Depo Provera injection. LMP 07/03/20. Given IM RUOQ. Patient tolerated well.

## 2020-07-06 LAB — LIPID PANEL
Chol/HDL Ratio: 5.1 ratio — ABNORMAL HIGH (ref 0.0–4.4)
Cholesterol, Total: 178 mg/dL (ref 100–199)
HDL: 35 mg/dL — ABNORMAL LOW (ref >39)
LDL Chol Calc (NIH): 116 mg/dL — ABNORMAL HIGH (ref 0–99)
Triglycerides: 150 mg/dL — ABNORMAL HIGH (ref 0–149)
VLDL Cholesterol Cal: 27 mg/dL (ref 5–40)

## 2020-07-06 LAB — HEMOGLOBIN A1C
Est. average glucose Bld gHb Est-mCnc: 117 mg/dL
Hgb A1c MFr Bld: 5.7 % — ABNORMAL HIGH (ref 4.8–5.6)

## 2020-07-06 LAB — TSH: TSH: 0.34 u[IU]/mL — ABNORMAL LOW (ref 0.450–4.500)

## 2020-07-07 ENCOUNTER — Telehealth: Payer: Self-pay

## 2020-07-07 NOTE — Telephone Encounter (Signed)
-----   Message from Jerrol Banana, MD sent at 07/07/2020  8:45 AM EDT ----- Thank you for getting these additional labs Ms. Wessler, I am glad that we did as they are out of range. These are the most commonly seen numbers to be up, your sugar and cholesterol numbers. I also saw the thyroid number a little low. We are going to review all of these in detail at your follow-up but right now focus on healthier eating (less fats, carbs, etc), and trying to get 30 minutes of light activity (nothing where you are out of breath) daily. See you soon.

## 2020-07-07 NOTE — Telephone Encounter (Signed)
Patient notified and verbalized understanding. No further questions at this time. Follow-up appointment 07/19/2020.

## 2020-07-19 ENCOUNTER — Ambulatory Visit: Payer: Medicaid Other | Admitting: Family Medicine

## 2020-07-26 ENCOUNTER — Telehealth (HOSPITAL_COMMUNITY): Payer: No Typology Code available for payment source | Admitting: Physician Assistant

## 2020-07-26 ENCOUNTER — Telehealth (HOSPITAL_COMMUNITY): Payer: No Typology Code available for payment source | Admitting: Family

## 2020-07-28 ENCOUNTER — Telehealth (HOSPITAL_COMMUNITY): Payer: Self-pay | Admitting: Physician Assistant

## 2020-07-28 ENCOUNTER — Telehealth (HOSPITAL_COMMUNITY): Payer: Self-pay | Admitting: *Deleted

## 2020-07-28 NOTE — Telephone Encounter (Signed)
Per front desk staff patient is requesting her medicine be called in to last her till her next appt which is on 5/10. She will be out of her medicines by 4/24, will reach out to her provider, Eddie PA for new rxs.

## 2020-07-28 NOTE — Telephone Encounter (Signed)
Appointment 08/23/20.  Pt requesting medication be filled until next appt.

## 2020-07-29 ENCOUNTER — Other Ambulatory Visit (HOSPITAL_COMMUNITY): Payer: Self-pay | Admitting: Physician Assistant

## 2020-07-29 DIAGNOSIS — F411 Generalized anxiety disorder: Secondary | ICD-10-CM

## 2020-07-29 DIAGNOSIS — F331 Major depressive disorder, recurrent, moderate: Secondary | ICD-10-CM

## 2020-07-29 MED ORDER — HYDROXYZINE HCL 10 MG PO TABS: 10.0000 mg | ORAL_TABLET | Freq: Three times a day (TID) | ORAL | 2 refills | Status: AC | PRN

## 2020-07-29 MED ORDER — FLUOXETINE HCL 20 MG PO CAPS: 20.0000 mg | ORAL_CAPSULE | Freq: Every day | ORAL | 2 refills | Status: AC

## 2020-07-29 NOTE — Progress Notes (Signed)
Provider was contacted by Suzanne K Beck, RN regarding patient's medication refills. Patient's medications to be e-prescribed to pharmacy of choice.

## 2020-07-29 NOTE — Telephone Encounter (Signed)
Provider was contacted by Wynona Luna, RN regarding patient's medication refills. Patient's medications to be e-prescribed to pharmacy of choice.

## 2020-08-01 ENCOUNTER — Other Ambulatory Visit: Payer: Self-pay

## 2020-08-01 ENCOUNTER — Ambulatory Visit (INDEPENDENT_AMBULATORY_CARE_PROVIDER_SITE_OTHER): Payer: Medicaid Other | Admitting: Family Medicine

## 2020-08-01 ENCOUNTER — Encounter: Payer: Self-pay | Admitting: Family Medicine

## 2020-08-01 VITALS — BP 152/102 | HR 58 | Temp 98.4°F | Ht 63.0 in | Wt 200.0 lb

## 2020-08-01 DIAGNOSIS — R7989 Other specified abnormal findings of blood chemistry: Secondary | ICD-10-CM | POA: Diagnosis not present

## 2020-08-01 DIAGNOSIS — I10 Essential (primary) hypertension: Secondary | ICD-10-CM

## 2020-08-01 DIAGNOSIS — E119 Type 2 diabetes mellitus without complications: Secondary | ICD-10-CM | POA: Diagnosis not present

## 2020-08-01 DIAGNOSIS — R202 Paresthesia of skin: Secondary | ICD-10-CM

## 2020-08-01 DIAGNOSIS — E782 Mixed hyperlipidemia: Secondary | ICD-10-CM

## 2020-08-01 MED ORDER — METFORMIN HCL ER 500 MG PO TB24: 500.0000 mg | ORAL_TABLET | Freq: Every day | ORAL | 1 refills | Status: DC

## 2020-08-01 MED ORDER — ATORVASTATIN CALCIUM 10 MG PO TABS: 10.0000 mg | ORAL_TABLET | Freq: Every day | ORAL | 1 refills | Status: DC

## 2020-08-01 MED ORDER — LISINOPRIL-HYDROCHLOROTHIAZIDE 10-12.5 MG PO TABS: 1.0000 | ORAL_TABLET | Freq: Every day | ORAL | 1 refills | Status: DC

## 2020-08-01 NOTE — Patient Instructions (Addendum)
Diet & Exercise Recommendations Dietary changes to include reducing saturated fats, sodium, avoiding red meat, fried, processed foods, full-fat dairy, baked goods, and sweets. Incorporate more fruits, vegetables, whole grains, and transition to more eggs and lean meats. Make realistic changes where you can and stick to it!  Physical activity should be focused on getting 150 to 300 minutes per week of moderate to vigorous activity (30 minutes of activity at least 5 days of the week at a level of intensity where you can carry on a conversation without being out of breath). Any increase in activity is beneficial to your health! . Evidence for the benefits of physical activity for weight gain, adiposity, and bone health exists for children as young as three years. . Increasing physical activity in older adults can help them maintain independence by reducing cognitive decline and falls. . Physical activity reduces symptoms of depression and anxiety and improves sleep quality.    Neck Spasm Exercises  Your healthcare provider may recommend exercises to help you heal. Talk to your healthcare provider or physical therapist about which exercises will best help you and how to do them correctly and safely.  You may do all of these exercises right away but avoid any movements that increase your pain.  Neck rotation with flexion Right side: Turn your head to the right and clasp your hands behind your head. Let the weight of your arms pull your chin to the right side of your chest. Relax. Hold for a count of 15. Do this 3 times.  Left side: Turn your head to the left and clasp your hands behind your head. Let the weight of your arms pull your chin to the left side of your chest. Relax. Hold for a count of 15. Do this 3 times.  Chin tuck: Place your fingertips on your chin and gently push your head straight back as if you are trying to make a double chin. Keep looking forward as your head moves back. Hold 5  seconds and repeat 5 times. Scal[place] stretch: Sit or stand and clasp both hands behind your back. Lower your left shoulder and tilt your head toward the right until you feel a stretch. Hold this position for 15 to 30 seconds and then come back to the starting position. Then lower your right shoulder and tilt your head toward the left. Hold for 15 to 30 seconds. Repeat 3 times on each side. Neck rotation stretch Right side: Rotate your neck by looking over your right shoulder. Lift your right hand and place your palm on the left side of your chin. Push your chin with your palm toward your right shoulder. Hold for a count of 10. Do this 3 times.  Left side: Rotate your neck by looking over your left shoulder. Lift your left hand and place your palm on the right side of your chin. Push your chin with your palm toward your left shoulder. Hold for a count of 10. Do this 3 times.  Scapular squeeze: While sitting or standing with your arms by your sides, squeeze your shoulder blades together and hold for 5 seconds. Do 2 sets of 15. Thoracic extension: Sit in a chair and clasp both arms behind your head. Gently arch backward and look up toward the ceiling. Repeat 10 times. Do this several times each day. Head lift with neck curl: Lie on your back with your knees bent and your feet flat on the floor. Tuck your chin and lift your head about 3  inches off the floor, keeping your shoulders flat on the floor. Hold for 10 seconds. Repeat 5 times. Try to work up to holding for 20 to 30 seconds and repeat 5 times.

## 2020-08-01 NOTE — Assessment & Plan Note (Signed)
Persistent elevation on subsequent visits in the setting of recently seen hyperlipidemia and diabetes mellitus type 2.  Plan for lisinopril-hydrochlorothiazide, diet and exercise management, return in 3 months.  Patient was advised on clinical features dictating sooner follow-up.

## 2020-08-01 NOTE — Progress Notes (Signed)
Primary Care / Sports Medicine Office Visit  Patient Information:  Patient ID: Allison Brown, female DOB: [birth year] Age: 40 y.o. MRN: [id number]   Astou Turvey is a pleasant 40 y.o. female presenting with the following:  Chief Complaint  Patient presents with  . Neck Pain    Better; no neck pain in office today  . Hypertension  . Arm Pain    Right; started x1 month ago; no known injury; right-handedness; 8/10 pain today    Review of Systems  Respiratory: Negative for shortness of breath.   Cardiovascular: Negative for chest pain and leg swelling.  Musculoskeletal: Positive for myalgias and neck pain.  Neurological: Negative for dizziness and headaches.   pertinent details above   Patient Active Problem List   Diagnosis Date Noted  . Low serum thyroid stimulating hormone (TSH) 08/01/2020  . Type 2 diabetes mellitus without complication, without long-term current use of insulin (HCC) 08/01/2020  . Primary hypertension 08/01/2020  . Mixed hyperlipidemia 08/01/2020  . Routine general medical examination at a health care facility 06/28/2020  . Paresthesia of right upper extremity 06/28/2020  . Cervicalgia 06/28/2020  . Impacted cerumen of right ear 06/28/2020  . Body mass index (BMI) of 35.0-35.9 in adult 06/28/2020  . Generalized anxiety disorder 04/29/2020  . Moderately severe depression (HCC) 04/29/2020  . Moderate episode of recurrent major depressive disorder (HCC) 04/29/2020   Past Medical History:  Diagnosis Date  . Cocaine abuse (HCC)   . ETOH abuse   . Hypertension   . Marijuana abuse    Outpatient Medications Prior to Visit  Medication Sig Dispense Refill  . FLUoxetine (PROZAC) 20 MG capsule Take 1 capsule (20 mg total) by mouth daily. 30 capsule 2  . hydrOXYzine (ATARAX/VISTARIL) 10 MG tablet Take 1 tablet (10 mg total) by mouth 3 (three) times daily as needed for anxiety. 90 tablet 2  . medroxyPROGESTERone (DEPO-PROVERA) 150 MG/ML injection Inject 1  mL (150 mg total) into the muscle every 3 (three) months. 1 mL 3   No facility-administered medications prior to visit.    Past Surgical History:  Procedure Laterality Date  . CESAREAN SECTION     Social History   Socioeconomic History  . Marital status: Legally Separated    Spouse name: Not on file  . Number of children: 4  . Years of education: Not on file  . Highest education level: Not on file  Occupational History  . Not on file  Tobacco Use  . Smoking status: Former Smoker    Types: Cigarettes    Quit date: 03/20/2020    Years since quitting: 0.3  . Smokeless tobacco: Never Used  Vaping Use  . Vaping Use: Never used  Substance and Sexual Activity  . Alcohol use: Not Currently    Comment: in rehab  . Drug use: Not Currently    Comment: in rehab  . Sexual activity: Yes    Birth control/protection: Condom, None  Other Topics Concern  . Not on file  Social History Narrative  . Not on file   Social Determinants of Health   Financial Resource Strain: Not on file  Food Insecurity: Not on file  Transportation Needs: Not on file  Physical Activity: Not on file  Stress: Not on file  Social Connections: Not on file  Intimate Partner Violence: Not on file   History reviewed. No pertinent family history. Allergies  Allergen Reactions  . Hydrocodone Itching    Vitals:   08/01/20 1331  BP: Marland Kitchen)  152/102  Pulse: (!) 58  Temp: 98.4 F (36.9 C)  SpO2: 99%   Vitals:   08/01/20 1331  Weight: 200 lb (90.7 kg)  Height: 5\' 3"  (1.6 m)   Body mass index is 35.43 kg/m.  No results found.   Independent interpretation of notes and tests performed by another provider:   None  Procedures performed:   None  Pertinent History, Exam, Impression, and Recommendations:   Primary hypertension Persistent elevation on subsequent visits in the setting of recently seen hyperlipidemia and diabetes mellitus type 2.  Plan for lisinopril-hydrochlorothiazide, diet and exercise  management, return in 3 months.  Patient was advised on clinical features dictating sooner follow-up.  Type 2 diabetes mellitus without complication, without long-term current use of insulin (HCC) Elevated A1c, in the setting of risk factors inclusive of hyperlipidemia and hypertension diet and exercise modifications as well as metformin initiation reviewed.  We will recheck her A1c in the clinic in 3 months.  Paresthesia of right upper extremity Though her neck pain has improved her right upper extremity symptoms are most attributable to her cervical spine where corresponding degenerative changes have been noted on x-ray review.  Her exam reveals negative Spurling's bilaterally, preserved upper extremity strength that is symmetric, minimally tender paraspinal musculature consistent with her improved subjective symptoms, and sensation appears intact and symmetric in her upper extremities.  Given her limited symptoms we will start with home-based rehab and as needed OTC medications.  We will track this issue at her return and if persistently symptomatic can consider formal physical therapy, oral pharmacotherapy, advanced imaging as clinically dictated.  Low serum thyroid stimulating hormone (TSH) Thyroid panel ordered, she has no noted thyromegaly or nodularity on exam, no proptosis evident.  I will review the results with the patient once available and we will coordinate next steps accordingly.  Mixed hyperlipidemia ASCVD risk stratification reveals benefit from moderate intensity statin, this was initiated today.  She was also advised on appropriate diet and exercise modifications.    Orders & Medications Meds ordered this encounter  Medications  . metFORMIN (GLUCOPHAGE XR) 500 MG 24 hr tablet    Sig: Take 1 tablet (500 mg total) by mouth daily with breakfast.    Dispense:  90 tablet    Refill:  1  . atorvastatin (LIPITOR) 10 MG tablet    Sig: Take 1 tablet (10 mg total) by mouth daily.     Dispense:  90 tablet    Refill:  1  . lisinopril-hydrochlorothiazide (ZESTORETIC) 10-12.5 MG tablet    Sig: Take 1 tablet by mouth daily.    Dispense:  90 tablet    Refill:  1   Orders Placed This Encounter  Procedures  . Thyrotropin receptor autoabs  . TSH+T4F+T3Free     Return in about 3 months (around 10/31/2020) for follow-up DM2, HTN.     11/02/2020, MD   Primary Care Sports Medicine [facility]

## 2020-08-01 NOTE — Assessment & Plan Note (Signed)
Thyroid panel ordered, she has no noted thyromegaly or nodularity on exam, no proptosis evident.  I will review the results with the patient once available and we will coordinate next steps accordingly.

## 2020-08-01 NOTE — Assessment & Plan Note (Signed)
ASCVD risk stratification reveals benefit from moderate intensity statin, this was initiated today.  She was also advised on appropriate diet and exercise modifications.

## 2020-08-01 NOTE — Assessment & Plan Note (Signed)
Elevated A1c, in the setting of risk factors inclusive of hyperlipidemia and hypertension diet and exercise modifications as well as metformin initiation reviewed.  We will recheck her A1c in the clinic in 3 months.

## 2020-08-01 NOTE — Assessment & Plan Note (Addendum)
Though her neck pain has improved her right upper extremity symptoms are most attributable to her cervical spine where corresponding degenerative changes have been noted on x-ray review.  Her exam reveals negative Spurling's bilaterally, preserved upper extremity strength that is symmetric, minimally tender paraspinal musculature consistent with her improved subjective symptoms, and sensation appears intact and symmetric in her upper extremities.  Given her limited symptoms we will start with home-based rehab and as needed OTC medications.  We will track this issue at her return and if persistently symptomatic can consider formal physical therapy, oral pharmacotherapy, advanced imaging as clinically dictated.

## 2020-08-02 ENCOUNTER — Encounter: Payer: Self-pay | Admitting: Family Medicine

## 2020-08-02 ENCOUNTER — Other Ambulatory Visit: Payer: Self-pay

## 2020-08-02 DIAGNOSIS — R7989 Other specified abnormal findings of blood chemistry: Secondary | ICD-10-CM

## 2020-08-02 LAB — TSH+T4F+T3FREE
Free T4: 1.03 ng/dL (ref 0.82–1.77)
T3, Free: 3 pg/mL (ref 2.0–4.4)
TSH: 0.357 u[IU]/mL — ABNORMAL LOW (ref 0.450–4.500)

## 2020-08-02 LAB — THYROTROPIN RECEPTOR AUTOABS: Thyrotropin Receptor Ab: <1.1 IU/L (ref 0.00–1.75)

## 2020-08-02 NOTE — Progress Notes (Signed)
Patient notified and verbalized understanding.  Will have repeat thyroid panel around 10/24/20.  Ordered.  No further questions at this time.

## 2020-08-12 ENCOUNTER — Ambulatory Visit
Admission: RE | Admit: 2020-08-12 | Discharge: 2020-08-12 | Disposition: A | Discharge status: Home / Self Care | Payer: Medicaid Other | Source: Ambulatory Visit | Attending: Family Medicine | Admitting: Family Medicine

## 2020-08-12 ENCOUNTER — Ambulatory Visit: Payer: Self-pay | Admitting: *Deleted

## 2020-08-12 ENCOUNTER — Other Ambulatory Visit: Payer: Self-pay | Admitting: Family Medicine

## 2020-08-12 ENCOUNTER — Encounter: Payer: Self-pay | Admitting: Family Medicine

## 2020-08-12 ENCOUNTER — Ambulatory Visit (INDEPENDENT_AMBULATORY_CARE_PROVIDER_SITE_OTHER): Payer: Medicaid Other | Admitting: Family Medicine

## 2020-08-12 ENCOUNTER — Other Ambulatory Visit: Payer: Self-pay

## 2020-08-12 ENCOUNTER — Ambulatory Visit
Admission: RE | Admit: 2020-08-12 | Discharge: 2020-08-12 | Disposition: A | Discharge status: Home / Self Care | Payer: Medicaid Other | Attending: Family Medicine | Admitting: Family Medicine

## 2020-08-12 VITALS — BP 102/72 | HR 82 | Temp 98.4°F | Ht 63.0 in | Wt 198.4 lb

## 2020-08-12 DIAGNOSIS — M778 Other enthesopathies, not elsewhere classified: Secondary | ICD-10-CM | POA: Diagnosis not present

## 2020-08-12 DIAGNOSIS — M25531 Pain in right wrist: Secondary | ICD-10-CM | POA: Diagnosis not present

## 2020-08-12 NOTE — Patient Instructions (Signed)
-   Use brace throughout the day consistently for 1 week, nighttime use okay - After 1 week remove brace while at home at 30-minute intervals for gentle range of motion to avoid stiffness (can open and close hand making a fist) - Apply topical anti-inflammatory Pennsaid using sample provided every morning and every evening to clean and dry skin for 2 weeks - Contact our office for any questions or for symptoms that fail to improve at or beyond the 2-week mark

## 2020-08-12 NOTE — Progress Notes (Signed)
Primary Care / Sports Medicine Office Visit  Patient Information:  Patient ID: Allison Brown, female DOB: 10-30-1980 Age: 40 y.o. MRN: [id number]   Allison Brown is a pleasant 40 y.o. female presenting with the following:  Chief Complaint  Patient presents with  . Hand Pain    Right hand and wrist swelling since Sunday, 08/07/20; no known injury; swelling does not extend up arm, redness to wrist area associated; states right wrist area is painful and the pain goes into the hand, some numbness associated to the right hand; patient is able to move hand and fingers without difficulty; right-handedness; 1/10 pain    Review of Systems pertinent details above   Patient Active Problem List   Diagnosis Date Noted  . Tendinitis of flexor tendon of right hand 08/12/2020  . Low serum thyroid stimulating hormone (TSH) 08/01/2020  . Type 2 diabetes mellitus without complication, without long-term current use of insulin (HCC) 08/01/2020  . Primary hypertension 08/01/2020  . Mixed hyperlipidemia 08/01/2020  . Routine general medical examination at a health care facility 06/28/2020  . Paresthesia of right upper extremity 06/28/2020  . Cervicalgia 06/28/2020  . Impacted cerumen of right ear 06/28/2020  . Body mass index (BMI) of 35.0-35.9 in adult 06/28/2020  . Generalized anxiety disorder 04/29/2020  . Moderately severe depression (HCC) 04/29/2020  . Moderate episode of recurrent major depressive disorder (HCC) 04/29/2020   Past Medical History:  Diagnosis Date  . Cocaine abuse (HCC)   . ETOH abuse   . Hypertension   . Marijuana abuse    Outpatient Medications Prior to Visit  Medication Sig Dispense Refill  . atorvastatin (LIPITOR) 10 MG tablet Take 1 tablet (10 mg total) by mouth daily. 90 tablet 1  . FLUoxetine (PROZAC) 20 MG capsule Take 1 capsule (20 mg total) by mouth daily. 30 capsule 2  . hydrOXYzine (ATARAX/VISTARIL) 10 MG tablet Take 1 tablet (10 mg total) by mouth 3  (three) times daily as needed for anxiety. 90 tablet 2  . lisinopril-hydrochlorothiazide (ZESTORETIC) 10-12.5 MG tablet Take 1 tablet by mouth daily. 90 tablet 1  . medroxyPROGESTERone (DEPO-PROVERA) 150 MG/ML injection Inject 1 mL (150 mg total) into the muscle every 3 (three) months. 1 mL 3  . metFORMIN (GLUCOPHAGE XR) 500 MG 24 hr tablet Take 1 tablet (500 mg total) by mouth daily with breakfast. 90 tablet 1   No facility-administered medications prior to visit.    Past Surgical History:  Procedure Laterality Date  . CESAREAN SECTION     Social History   Socioeconomic History  . Marital status: Legally Separated    Spouse name: Not on file  . Number of children: 4  . Years of education: Not on file  . Highest education level: Not on file  Occupational History  . Not on file  Tobacco Use  . Smoking status: Former Smoker    Types: Cigarettes    Quit date: 03/20/2020    Years since quitting: 0.3  . Smokeless tobacco: Never Used  Vaping Use  . Vaping Use: Never used  Substance and Sexual Activity  . Alcohol use: Not Currently    Comment: in rehab  . Drug use: Not Currently    Types: "Crack" cocaine    Comment: in rehab  . Sexual activity: Yes    Birth control/protection: Condom, None  Other Topics Concern  . Not on file  Social History Narrative  . Not on file   Social Determinants of Health  Financial Resource Strain: Not on file  Food Insecurity: Not on file  Transportation Needs: Not on file  Physical Activity: Not on file  Stress: Not on file  Social Connections: Not on file  Intimate Partner Violence: Not on file   History reviewed. No pertinent family history. Allergies  Allergen Reactions  . Hydrocodone Itching    Vitals:   08/12/20 1126  BP: 102/72  Pulse: 82  Temp: 98.4 F (36.9 C)  SpO2: 97%   Vitals:   08/12/20 1126  Weight: 198 lb 6.4 oz (90 kg)  Height: 5\' 3"  (1.6 m)   Body mass index is 35.14 kg/m.  No results found.    Independent interpretation of notes and tests performed by another provider:   Independent interpretation of x-ray hand and wrist dated 08/12/2020 reveals no acute osseous abnormality throughout the visualized hand and wrist images, subtle degenerative changes noted at the scaphoid-trapezoid junction with marginal osteophyte at the proximal portion of the radial aspect of the trapezium.   Procedures performed:   None  Pertinent History, Exam, Impression, and Recommendations:   Tendinitis of flexor tendon of right hand Right-hand-dominant patient presenting with atraumatic 5-day history of hand, wrist, and forearm pain with associated swelling primarily around the volar hand.  She works as a 08/14/2020 with relative baseline overuse.  Symptoms have failed to improve over this course and she has attempted rest, mild improvement with 1 day dosing of OTC Aleve.  Her objective examination findings reveal mild swelling localized about the third MCP without skin discoloration, range of motion is full and painful primarily during resisted flexion isolated the third digit, maximal tenderness at the distal palmar crease, no triggering appreciated.  Sensation intact.Symptoms most consistent with right and third digit flexor tendinitis.   Plan for TKO immobilization x2 weeks with gentle home range of motion after 1 week, scheduled topical Rx Pennsaid sample provided (appropriate use discussed).  If symptoms persist at review of the 2-week mark she is to contact our office.  At that point can consider escalation of pharmacotherapy, local injection, formal physical therapy as clinically guided.    Orders & Medications No orders of the defined types were placed in this encounter.  No orders of the defined types were placed in this encounter.    Return if symptoms worsen or fail to improve in 2 weeks contact Conservation officer, nature.     Allison Brown   Primary Care Sports Medicine [facility]

## 2020-08-12 NOTE — Assessment & Plan Note (Addendum)
Right-hand-dominant patient presenting with atraumatic 5-day history of hand, wrist, and forearm pain with associated swelling primarily around the volar hand.  She works as a Conservation officer, nature with relative baseline overuse.  Symptoms have failed to improve over this course and she has attempted rest, mild improvement with 1 day dosing of OTC Aleve.  Her objective examination findings reveal mild swelling localized about the third MCP without skin discoloration, range of motion is full and painful primarily during resisted flexion isolated the third digit, maximal tenderness at the distal palmar crease, no triggering appreciated.  Sensation intact.Symptoms most consistent with right and third digit flexor tendinitis.   Plan for TKO immobilization x2 weeks with gentle home range of motion after 1 week, scheduled topical Rx Pennsaid sample provided (appropriate use discussed).  If symptoms persist at review of the 2-week mark she is to contact our office.  At that point can consider escalation of pharmacotherapy, local injection, formal physical therapy as clinically guided.

## 2020-08-12 NOTE — Telephone Encounter (Signed)
Patient calling with complaints of swelling to the right wrist that started on Sunday. Patient denies any recent injury. Patient states that swelling does not extend up arm. Patient also noted redness to wrist area. Patient states that wrist area is painful and the pain goes into the hand. Patient states she does have some numbness as well to the right hand. Patient is able to move hand and fingers without difficulty. Patient rates pain at 1 on a scale of 1-10. Patient scheduled for appt with Dr. Ashley Royalty on 08/12/20.   Reason for Disposition . [1] Red area or streak [2] large (> 2 in. or 5 cm)  Answer Assessment - Initial Assessment Questions 1. ONSET: "When did the swelling start?" (e.g., minutes, hours, days)     Started on Sunday 2. LOCATION: "What part of the arm is swollen?"  "Are both arms swollen or just one arm?"    Tight wrist 3. SEVERITY: "How bad is the swelling?" (e.g., localized; mild, moderate, severe)   - LOCALIZED: Small area of puffiness or swelling on just one arm   - JOINT SWELLING: Swelling of one joint   - MILD: Puffiness or swelling of hand   - MODERATE: Puffiness or swollen feeling of entire arm    - SEVERE: All of arm looks swollen; pitting edema     mild 4. REDNESS: "Does the swelling look red or infected?"     Red at forearm 5. PAIN: "Is the swelling painful to touch?" If Yes, ask: "How painful is it?"   (Scale 1-10; mild, moderate or severe)     1 6. FEVER: "Do you have a fever?" If Yes, ask: "What is it, how was it measured, and when did it start?"      no 7. CAUSE: "What do you think is causing the arm swelling?"     unknown 8. MEDICAL HISTORY: "Do you have a history of heart failure, kidney disease, liver failure, or cancer?"     no 9. RECURRENT SYMPTOM: "Have you had arm swelling before?" If Yes, ask: "When was the last time?" "What happened that time?"     no 10. OTHER SYMPTOMS: "Do you have any other symptoms?" (e.g., chest pain, difficulty breathing)        no 11. PREGNANCY: "Is there any chance you are pregnant?" "When was your last menstrual period?"       No  Answer Assessment - Initial Assessment Questions 1. SYMPTOM: "What is the main symptom you are concerned about?" (e.g., weakness, numbness)     Right hand numbnes 2. ONSET: "When did this start?" (minutes, hours, days; while sleeping)     sunday 3. LAST NORMAL: "When was the last time you were normal (no symptoms)?"     Prior to sunday 4. PATTERN "Does this come and go, or has it been constant since it started?"  "Is it present now?"     Present now 5. CARDIAC SYMPTOMS: "Have you had any of the following symptoms: chest pain, difficulty breathing, palpitations?"     denies 6. NEUROLOGIC SYMPTOMS: "Have you had any of the following symptoms: headache, dizziness, vision loss, double vision, changes in speech, unsteady on your feet?"     denies 7. OTHER SYMPTOMS: "Do you have any other symptoms?"     Pain to wrist 8. PREGNANCY: "Is there any chance you are pregnant?" "When was your last menstrual period?"     no  Protocols used: ARM SWELLING AND EDEMA-A-AH, NEUROLOGIC DEFICIT-A-AH

## 2020-08-23 ENCOUNTER — Telehealth (HOSPITAL_COMMUNITY): Payer: Self-pay

## 2020-08-23 ENCOUNTER — Telehealth (HOSPITAL_COMMUNITY): Payer: No Typology Code available for payment source | Admitting: Physician Assistant

## 2020-08-23 NOTE — Telephone Encounter (Signed)
Pt called regarding scheduling appt with [facility].  Pt confirmed living in [place]. Also confirmed pt has been assigned [facility] / [facility]. Provided network ph# for [employer].

## 2020-08-31 ENCOUNTER — Telehealth: Payer: Self-pay

## 2020-08-31 NOTE — Telephone Encounter (Signed)
Copied from CRM [id number]. Topic: General - Call Back - No Documentation >> Aug 31, 2020 10:59 AM Randol Kern wrote: Reason for CRM: Pt called to report that she has recently been kicked out of her current living situation and is now homeless. She is requesting assistance from social work, please advise. Time sensitive   Best contact: [phone]

## 2020-08-31 NOTE — Telephone Encounter (Signed)
Attempted to call patient 3 times and went straight to voicemail.  Left detailed voicemail notifying patient to reach out to her Medicaid case worker for resources and assistance and that we could provide any medical records if needed.  For your information.

## 2020-09-16 ENCOUNTER — Ambulatory Visit: Payer: Self-pay | Admitting: Family Medicine

## 2020-09-27 ENCOUNTER — Ambulatory Visit: Payer: Medicaid Other

## 2020-10-10 ENCOUNTER — Encounter: Payer: Self-pay | Admitting: Family Medicine

## 2020-10-11 NOTE — Telephone Encounter (Signed)
Please advise.  If okay to send metformin, will get an updated pharmacy for patient.  I don't think a referral is needed to establish care with a new PCP, but we can send records if she signs a medical release with a new doctor.

## 2020-10-31 ENCOUNTER — Ambulatory Visit: Payer: Medicaid Other | Admitting: Family Medicine

## 2021-02-14 ENCOUNTER — Other Ambulatory Visit: Payer: Self-pay | Admitting: Family Medicine

## 2021-02-14 ENCOUNTER — Encounter: Payer: Self-pay | Admitting: Family Medicine

## 2021-02-14 DIAGNOSIS — I10 Essential (primary) hypertension: Secondary | ICD-10-CM

## 2021-02-14 DIAGNOSIS — E782 Mixed hyperlipidemia: Secondary | ICD-10-CM

## 2021-02-14 DIAGNOSIS — E119 Type 2 diabetes mellitus without complications: Secondary | ICD-10-CM

## 2021-02-14 NOTE — Telephone Encounter (Signed)
Okay to send in medication and advise her to find PCP in closer proximity?

## 2021-02-16 MED ORDER — ATORVASTATIN CALCIUM 10 MG PO TABS: 10.0000 mg | ORAL_TABLET | Freq: Every day | ORAL | 0 refills | Status: DC

## 2021-02-16 MED ORDER — LISINOPRIL-HYDROCHLOROTHIAZIDE 10-12.5 MG PO TABS: 1.0000 | ORAL_TABLET | Freq: Every day | ORAL | 0 refills | Status: DC

## 2021-02-16 MED ORDER — METFORMIN HCL ER 500 MG PO TB24: 500.0000 mg | ORAL_TABLET | Freq: Every day | ORAL | 0 refills | Status: DC

## 2021-02-16 NOTE — Addendum Note (Signed)
Addended by: Lennart Pall on: 02/16/2021 09:53 AM   Modules accepted: Orders

## 2021-03-30 ENCOUNTER — Other Ambulatory Visit: Payer: Self-pay | Admitting: Family Medicine

## 2021-03-30 DIAGNOSIS — E782 Mixed hyperlipidemia: Secondary | ICD-10-CM

## 2021-03-30 DIAGNOSIS — I10 Essential (primary) hypertension: Secondary | ICD-10-CM

## 2021-03-30 DIAGNOSIS — E119 Type 2 diabetes mellitus without complications: Secondary | ICD-10-CM

## 2021-03-30 MED ORDER — ATORVASTATIN CALCIUM 10 MG PO TABS: 10.0000 mg | ORAL_TABLET | Freq: Every day | ORAL | 0 refills | Status: AC

## 2021-03-30 MED ORDER — LISINOPRIL-HYDROCHLOROTHIAZIDE 10-12.5 MG PO TABS
1.0000 | ORAL_TABLET | Freq: Every day | ORAL | 0 refills | Status: AC
Start: 2021-03-30 — End: ?

## 2021-03-30 MED ORDER — METFORMIN HCL ER 500 MG PO TB24: 500.0000 mg | ORAL_TABLET | Freq: Every day | ORAL | 0 refills | Status: DC

## 2021-05-08 ENCOUNTER — Other Ambulatory Visit: Payer: Self-pay | Admitting: Family Medicine

## 2021-05-08 DIAGNOSIS — E119 Type 2 diabetes mellitus without complications: Secondary | ICD-10-CM

## 2021-05-08 NOTE — Telephone Encounter (Signed)
Requested medications are due for refill today.  yes  Requested medications are on the active medications list.  yes  Last refill. 03/30/2021 #30 0 refills  Future visit scheduled.   no  Notes to clinic.  Pt now living in [place] per note of 02/14/2021. Looking to establish care with new provider. Overdue for OV.    Requested Prescriptions  Pending Prescriptions Disp Refills   metFORMIN (GLUCOPHAGE-XR) 500 MG 24 hr tablet [Pharmacy Med Name: metFORMIN HCl ER 500 MG Oral Tablet Extended Release 24 Hour] 30 tablet 0    Sig: Take 1 tablet by mouth once daily with breakfast     Endocrinology:  Diabetes - Biguanides Failed - 05/08/2021 12:51 PM      Failed - HBA1C is between 0 and 7.9 and within 180 days    Hgb A1c MFr Bld  Date Value Ref Range Status  07/05/2020 5.7 (H) 4.8 - 5.6 % Final    Comment:             Prediabetes: 5.7 - 6.4          Diabetes: >6.4          Glycemic control for adults with diabetes: <7.0           Failed - AA eGFR in normal range and within 360 days    eGFR  Date Value Ref Range Status  06/28/2020 112 >59 mL/min/1.73 Final          Failed - Valid encounter within last 6 months    Recent Outpatient Visits           8 months ago Acute wrist pain, right   [facility] Montel Culver, MD   9 months ago Low serum thyroid stimulating hormone (TSH)   [facility] Montel Culver, MD   10 months ago Routine general medical examination at a health care facility   [facility] Montel Culver, MD              Passed - Cr in normal range and within 360 days    Creatinine, Ser  Date Value Ref Range Status  06/28/2020 0.71 0.57 - 1.00 mg/dL Final

## 2021-05-09 ENCOUNTER — Telehealth: Payer: Self-pay

## 2021-05-09 NOTE — Telephone Encounter (Signed)
Left message for patient to return call to discuss medication adherence and if patient has established with a PCP closer to her new residence.  For your information.

## 2021-05-19 ENCOUNTER — Ambulatory Visit: Payer: Medicaid Other | Admitting: Family Medicine

## 2021-08-10 ENCOUNTER — Other Ambulatory Visit (HOSPITAL_COMMUNITY): Payer: Self-pay | Admitting: Physician Assistant

## 2021-08-10 ENCOUNTER — Other Ambulatory Visit: Payer: Self-pay | Admitting: Family Medicine

## 2021-08-10 DIAGNOSIS — F411 Generalized anxiety disorder: Secondary | ICD-10-CM

## 2021-08-10 DIAGNOSIS — E782 Mixed hyperlipidemia: Secondary | ICD-10-CM

## 2021-08-10 DIAGNOSIS — F331 Major depressive disorder, recurrent, moderate: Secondary | ICD-10-CM

## 2021-08-10 DIAGNOSIS — E119 Type 2 diabetes mellitus without complications: Secondary | ICD-10-CM

## 2021-08-10 DIAGNOSIS — I10 Essential (primary) hypertension: Secondary | ICD-10-CM

## 2021-08-10 NOTE — Telephone Encounter (Signed)
Shall I provide a bridge for her medications?

## 2021-08-10 NOTE — Telephone Encounter (Signed)
Patient needs a follow up appointment in order for medication to be prescribed. Please and thanks.

## 2021-08-10 NOTE — Telephone Encounter (Signed)
Unable to move forward with scheduling, patient resides in [facility].  ?

## 2021-08-11 NOTE — Telephone Encounter (Signed)
Requested medication (s) are due for refill today - yes ? ?Requested medication (s) are on the active medication list -yes ? ?Future visit scheduled -no ? ?Last refill: Atorvastatin 03/30/21 #30 ?                Lisinopril 03/30/21 #30 ?                 Metformin 05/09/21 #30 ? ?Notes to clinic: Request RF: courtesy RF given, fails lab protocol, office has reached out to patient to schedule appointment- no response back ? ?Requested Prescriptions  ?Pending Prescriptions Disp Refills  ? atorvastatin (LIPITOR) 10 MG tablet [Pharmacy Med Name: Atorvastatin Calcium 10 MG Oral Tablet] 30 tablet 0  ?  Sig: Take 1 tablet by mouth once daily  ?  ? Cardiovascular:  Antilipid - Statins Failed - 08/10/2021 10:01 AM  ?  ?  Failed - Valid encounter within last 12 months  ?  Recent Outpatient Visits   ? ?      ? 12 months ago Acute wrist pain, right  ? [facility] Montel Culver, MD  ? 1 year ago Low serum thyroid stimulating hormone (TSH)  ? [facility] Montel Culver, MD  ? 1 year ago Routine general medical examination at a health care facility  ? [facility] Montel Culver, MD  ? ?  ?  ? ? ?  ?  ?  Failed - Lipid Panel in normal range within the last 12 months  ?  Cholesterol, Total  ?Date Value Ref Range Status  ?07/05/2020 178 100 - 199 mg/dL Final  ? ?LDL Chol Calc (NIH)  ?Date Value Ref Range Status  ?07/05/2020 116 (H) 0 - 99 mg/dL Final  ? ?HDL  ?Date Value Ref Range Status  ?07/05/2020 35 (L) >39 mg/dL Final  ? ?Triglycerides  ?Date Value Ref Range Status  ?07/05/2020 150 (H) 0 - 149 mg/dL Final  ? ?  ?  ?  Passed - Patient is not pregnant  ?  ?  ? metFORMIN (GLUCOPHAGE-XR) 500 MG 24 hr tablet [Pharmacy Med Name: metFORMIN HCl ER 500 MG Oral Tablet Extended Release 24 Hour] 30 tablet 0  ?  Sig: Take 1 tablet by mouth once daily with breakfast  ?  ? Endocrinology:  Diabetes - Biguanides Failed - 08/10/2021 10:01 AM  ?  ?  Failed - Cr in normal range and within 360 days  ?  Creatinine,  Ser  ?Date Value Ref Range Status  ?06/28/2020 0.71 0.57 - 1.00 mg/dL Final  ?  ?  ?  ?  Failed - HBA1C is between 0 and 7.9 and within 180 days  ?  Hgb A1c MFr Bld  ?Date Value Ref Range Status  ?07/05/2020 5.7 (H) 4.8 - 5.6 % Final  ?  Comment:  ?           Prediabetes: 5.7 - 6.4 ?         Diabetes: >6.4 ?         Glycemic control for adults with diabetes: <7.0 ?  ?  ?  ?  ?  Failed - eGFR in normal range and within 360 days  ?  eGFR  ?Date Value Ref Range Status  ?06/28/2020 112 >59 mL/min/1.73 Final  ?  ?  ?  ?  Failed - B12 Level in normal range and within 720 days  ?  No results found for: VITAMINB12  ?  ?  ?  Failed - Valid encounter within last 6 months  ?  Recent Outpatient Visits   ? ?      ? 12 months ago Acute wrist pain, right  ? [facility] Montel Culver, MD  ? 1 year ago Low serum thyroid stimulating hormone (TSH)  ? [facility] Montel Culver, MD  ? 1 year ago Routine general medical examination at a health care facility  ? [facility] Montel Culver, MD  ? ?  ?  ? ? ?  ?  ?  Failed - CBC within normal limits and completed in the last 12 months  ?  WBC  ?Date Value Ref Range Status  ?06/28/2020 7.3 3.4 - 10.8 x10E3/uL Final  ? ?RBC  ?Date Value Ref Range Status  ?06/28/2020 4.51 3.77 - 5.28 x10E6/uL Final  ? ?Hemoglobin  ?Date Value Ref Range Status  ?06/28/2020 12.3 11.1 - 15.9 g/dL Final  ? ?Hematocrit  ?Date Value Ref Range Status  ?06/28/2020 37.7 34.0 - 46.6 % Final  ? ?MCHC  ?Date Value Ref Range Status  ?06/28/2020 32.6 31.5 - 35.7 g/dL Final  ? ?MCH  ?Date Value Ref Range Status  ?06/28/2020 27.3 26.6 - 33.0 pg Final  ? ?MCV  ?Date Value Ref Range Status  ?06/28/2020 84 79 - 97 fL Final  ? ?No results found for: PLTCOUNTKUC, LABPLAT, [place] ?RDW  ?Date Value Ref Range Status  ?06/28/2020 13.1 11.7 - 15.4 % Final  ? ?  ?  ?  ? lisinopril-hydrochlorothiazide (ZESTORETIC) 10-12.5 MG tablet [Pharmacy Med Name: Lisinopril-hydroCHLOROthiazide 10-12.5 MG Oral  Tablet] 30 tablet 0  ?  Sig: Take 1 tablet by mouth once daily  ?  ? Cardiovascular:  ACEI + Diuretic Combos Failed - 08/10/2021 10:01 AM  ?  ?  Failed - Na in normal range and within 180 days  ?  Sodium  ?Date Value Ref Range Status  ?06/28/2020 138 134 - 144 mmol/L Final  ?  ?  ?  ?  Failed - K in normal range and within 180 days  ?  Potassium  ?Date Value Ref Range Status  ?06/28/2020 4.3 3.5 - 5.2 mmol/L Final  ?  ?  ?  ?  Failed - Cr in normal range and within 180 days  ?  Creatinine, Ser  ?Date Value Ref Range Status  ?06/28/2020 0.71 0.57 - 1.00 mg/dL Final  ?  ?  ?  ?  Failed - eGFR is 30 or above and within 180 days  ?  eGFR  ?Date Value Ref Range Status  ?06/28/2020 112 >59 mL/min/1.73 Final  ?  ?  ?  ?  Failed - Valid encounter within last 6 months  ?  Recent Outpatient Visits   ? ?      ? 12 months ago Acute wrist pain, right  ? [facility] Montel Culver, MD  ? 1 year ago Low serum thyroid stimulating hormone (TSH)  ? [facility] Montel Culver, MD  ? 1 year ago Routine general medical examination at a health care facility  ? [facility] Montel Culver, MD  ? ?  ?  ? ? ?  ?  ?  Passed - Patient is not pregnant  ?  ?  Passed - Last BP in normal range  ?  BP Readings from Last 1 Encounters:  ?08/12/20 102/72  ?  ?  ?  ?  ? ? ? ?Requested Prescriptions  ?  Pending Prescriptions Disp Refills  ? atorvastatin (LIPITOR) 10 MG tablet [Pharmacy Med Name: Atorvastatin Calcium 10 MG Oral Tablet] 30 tablet 0  ?  Sig: Take 1 tablet by mouth once daily  ?  ? Cardiovascular:  Antilipid - Statins Failed - 08/10/2021 10:01 AM  ?  ?  Failed - Valid encounter within last 12 months  ?  Recent Outpatient Visits   ? ?      ? 12 months ago Acute wrist pain, right  ? [facility] Montel Culver, MD  ? 1 year ago Low serum thyroid stimulating hormone (TSH)  ? [facility] Montel Culver, MD  ? 1 year ago Routine general medical examination at a health care facility  ?  [facility] Montel Culver, MD  ? ?  ?  ? ? ?  ?  ?  Failed - Lipid Panel in normal range within the last 12 months  ?  Cholesterol, Total  ?Date Value Ref Range Status  ?07/05/2020 178 100 - 199 mg/dL Final  ? ?LDL Chol Calc (NIH)  ?Date Value Ref Range Status  ?07/05/2020 116 (H) 0 - 99 mg/dL Final  ? ?HDL  ?Date Value Ref Range Status  ?07/05/2020 35 (L) >39 mg/dL Final  ? ?Triglycerides  ?Date Value Ref Range Status  ?07/05/2020 150 (H) 0 - 149 mg/dL Final  ? ?  ?  ?  Passed - Patient is not pregnant  ?  ?  ? metFORMIN (GLUCOPHAGE-XR) 500 MG 24 hr tablet [Pharmacy Med Name: metFORMIN HCl ER 500 MG Oral Tablet Extended Release 24 Hour] 30 tablet 0  ?  Sig: Take 1 tablet by mouth once daily with breakfast  ?  ? Endocrinology:  Diabetes - Biguanides Failed - 08/10/2021 10:01 AM  ?  ?  Failed - Cr in normal range and within 360 days  ?  Creatinine, Ser  ?Date Value Ref Range Status  ?06/28/2020 0.71 0.57 - 1.00 mg/dL Final  ?  ?  ?  ?  Failed - HBA1C is between 0 and 7.9 and within 180 days  ?  Hgb A1c MFr Bld  ?Date Value Ref Range Status  ?07/05/2020 5.7 (H) 4.8 - 5.6 % Final  ?  Comment:  ?           Prediabetes: 5.7 - 6.4 ?         Diabetes: >6.4 ?         Glycemic control for adults with diabetes: <7.0 ?  ?  ?  ?  ?  Failed - eGFR in normal range and within 360 days  ?  eGFR  ?Date Value Ref Range Status  ?06/28/2020 112 >59 mL/min/1.73 Final  ?  ?  ?  ?  Failed - B12 Level in normal range and within 720 days  ?  No results found for: VITAMINB12  ?  ?  ?  Failed - Valid encounter within last 6 months  ?  Recent Outpatient Visits   ? ?      ? 12 months ago Acute wrist pain, right  ? [facility] Montel Culver, MD  ? 1 year ago Low serum thyroid stimulating hormone (TSH)  ? [facility] Montel Culver, MD  ? 1 year ago Routine general medical examination at a health care facility  ? [facility] Montel Culver, MD  ? ?  ?  ? ? ?  ?  ?  Failed - CBC within normal  limits and completed in the last 12 months  ?  WBC  ?Date Value Ref Range Status  ?06/28/2020 7.3 3.4 - 10.8 x10E3/uL Final  ? ?RBC  ?Date Value Ref Range Status  ?06/28/2020 4.51 3.77 - 5.28 x10E6/uL F

## 2021-08-11 NOTE — Telephone Encounter (Signed)
No. Patient has not been seen since 05/2020.

## 2021-08-11 NOTE — Telephone Encounter (Signed)
Ok, I will disregard message.

## 2021-10-01 ENCOUNTER — Encounter: Payer: Self-pay | Admitting: Family Medicine

## 2021-11-14 ENCOUNTER — Other Ambulatory Visit (HOSPITAL_COMMUNITY): Payer: Self-pay | Admitting: Physician Assistant

## 2021-11-14 DIAGNOSIS — F331 Major depressive disorder, recurrent, moderate: Secondary | ICD-10-CM

## 2021-11-14 DIAGNOSIS — F411 Generalized anxiety disorder: Secondary | ICD-10-CM

## 2022-06-11 ENCOUNTER — Encounter (HOSPITAL_COMMUNITY): Payer: Self-pay

## 2022-06-11 ENCOUNTER — Telehealth (HOSPITAL_COMMUNITY): Payer: Self-pay | Admitting: Mental Health

## 2022-06-11 ENCOUNTER — Ambulatory Visit (HOSPITAL_COMMUNITY): Payer: Medicaid Other | Admitting: Mental Health
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
## 2022-08-27 ENCOUNTER — Telehealth: Payer: Self-pay

## 2022-08-27 NOTE — Transitions of Care (Post Inpatient/ED Visit) (Unsigned)
   08/27/2022  Name: Allison Brown MRN: [id number] DOB: 12/29/1980  Today's TOC FU Call Status: Today's TOC FU Call Status:: Unsuccessul Call (1st Attempt) Unsuccessful Call (1st Attempt) Date: 08/27/22  Attempted to reach the patient regarding the most recent Inpatient/ED visit.  Follow Up Plan: Additional outreach attempts will be made to reach the patient to complete the Transitions of Care (Post Inpatient/ED visit) call.   Signature Karena Addison, LPN [facility] Nurse Health Advisor Direct Dial [phone]

## 2022-08-28 NOTE — Transitions of Care (Post Inpatient/ED Visit) (Unsigned)
   08/28/2022  Name: Allison Brown MRN: [id number] DOB: [birth year]  Today's TOC FU Call Status: Today's TOC FU Call Status:: Unsuccessful Call (2nd Attempt) Unsuccessful Call (1st Attempt) Date: 08/27/22 Unsuccessful Call (2nd Attempt) Date: 08/28/22  Attempted to reach the patient regarding the most recent Inpatient/ED visit.  Follow Up Plan: Additional outreach attempts will be made to reach the patient to complete the Transitions of Care (Post Inpatient/ED visit) call.   Signature Karena Addison, LPN [facility] Nurse Health Advisor Direct Dial [phone]

## 2022-08-29 NOTE — Transitions of Care (Post Inpatient/ED Visit) (Signed)
   08/29/2022  Name: Allison Brown MRN: [id number] DOB: 11-Jun-1980  Today's TOC FU Call Status: Today's TOC FU Call Status:: Unsuccessful Call (3rd Attempt) Unsuccessful Call (1st Attempt) Date: 08/27/22 Unsuccessful Call (2nd Attempt) Date: 08/28/22 Unsuccessful Call (3rd Attempt) Date: 08/29/22  Attempted to reach the patient regarding the most recent Inpatient/ED visit.  Follow Up Plan: No further outreach attempts will be made at this time. We have been unable to contact the patient.  Signature Karena Addison, LPN [facility] Nurse Health Advisor Direct Dial [phone]

## 2022-09-14 IMAGING — CR DG HAND COMPLETE 3+V*R*
3 series · 3 of 3 positions shown · non-contrast
Comparison: None.

CLINICAL DATA: Pain

EXAM:
RIGHT WRIST - COMPLETE 3+ VIEW; RIGHT HAND - COMPLETE 3+ VIEW

[hand ap]
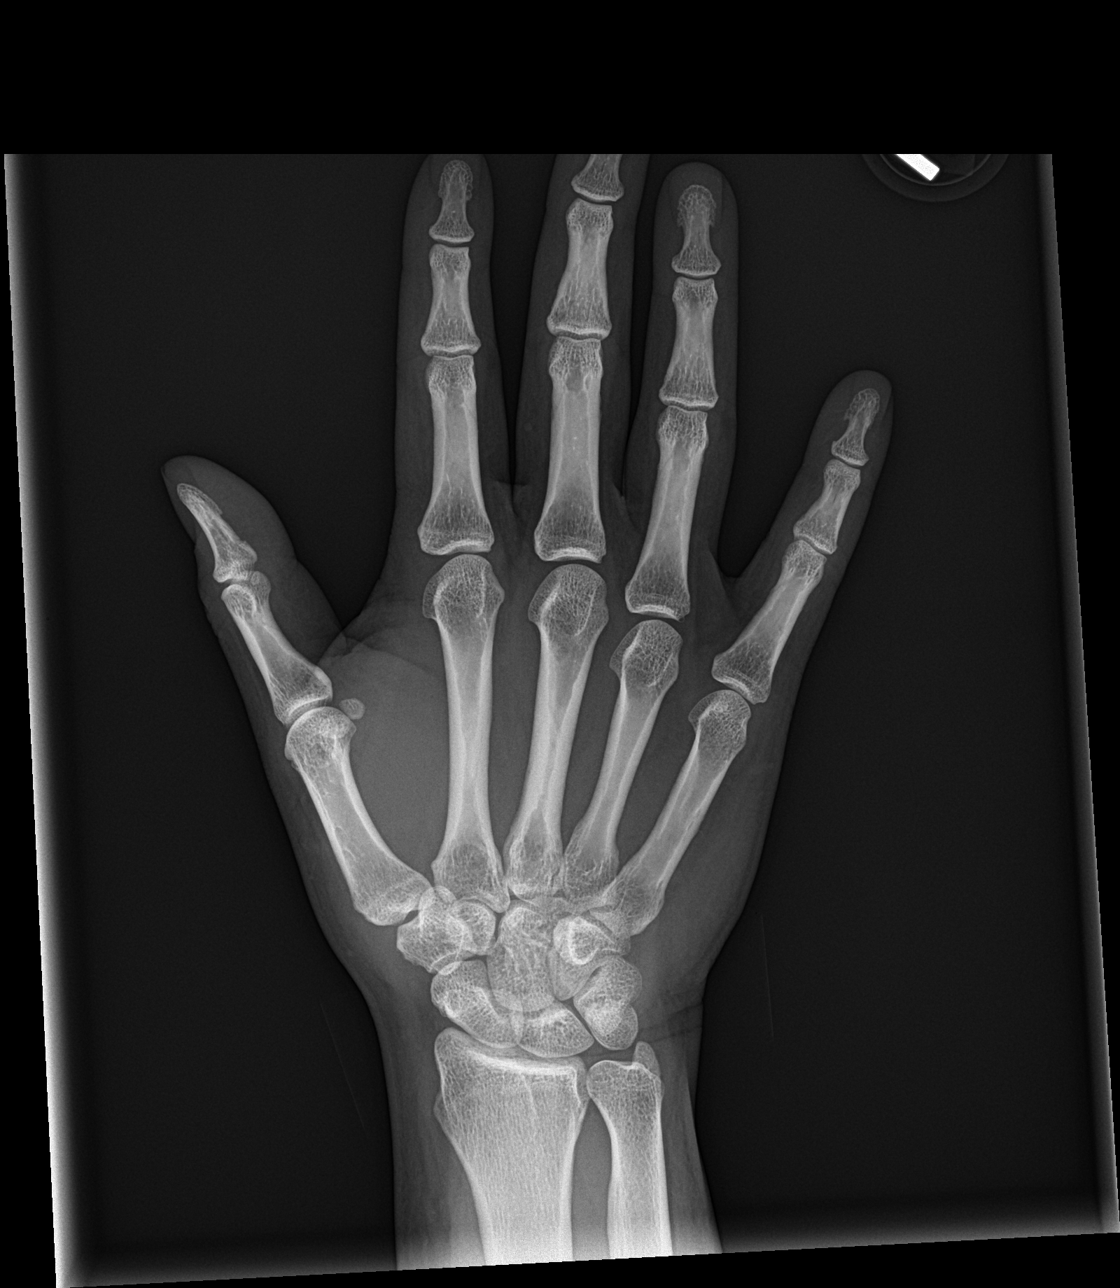

[hand obl]
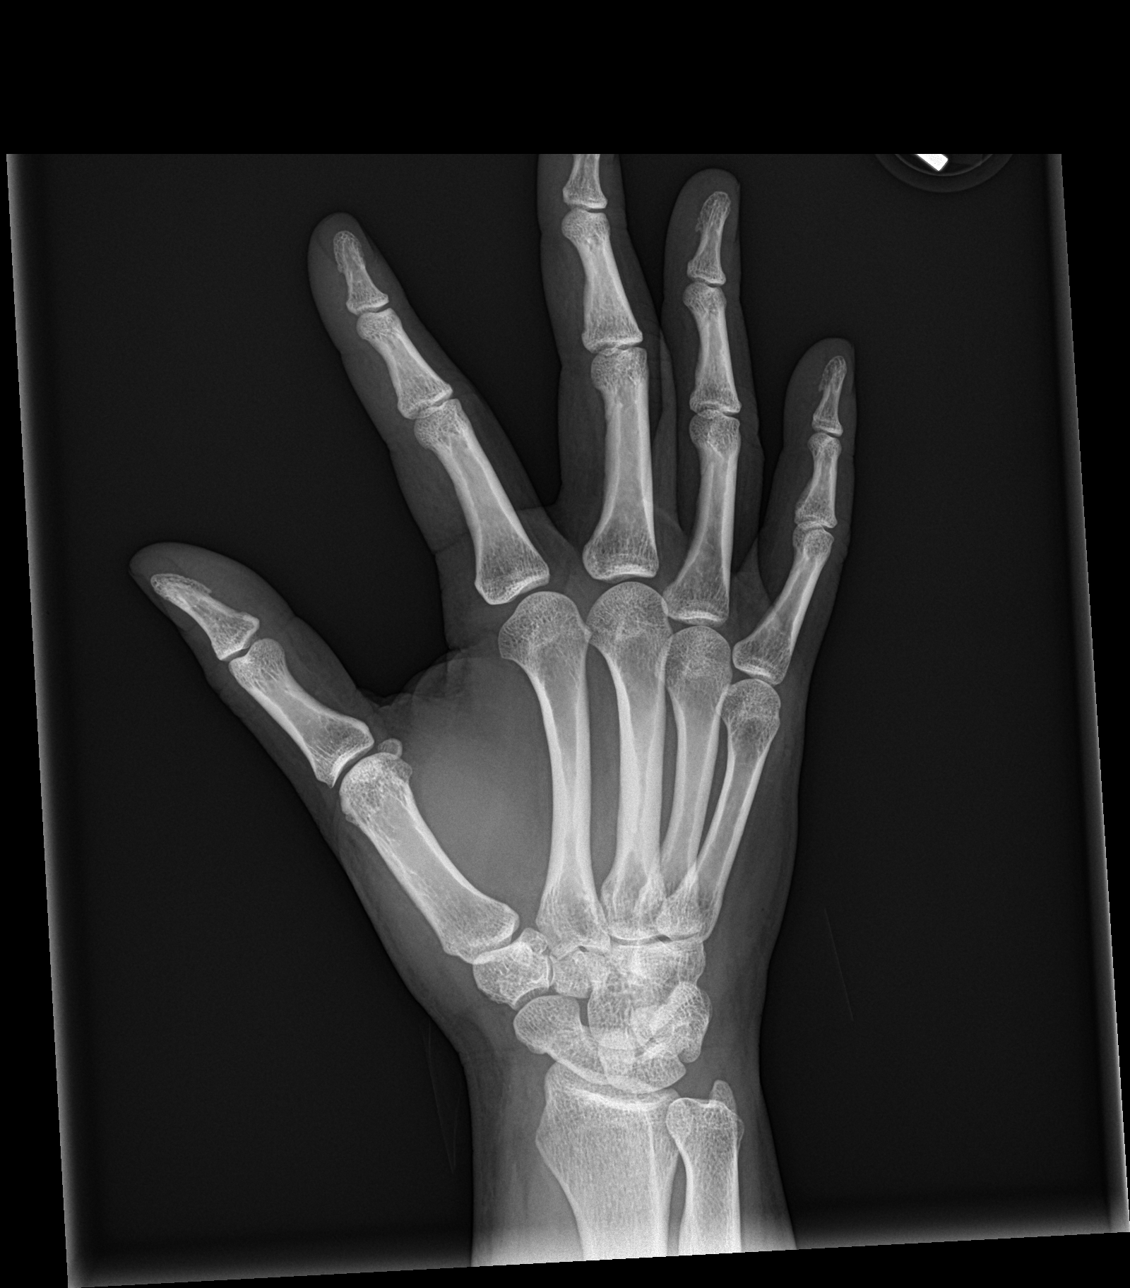

[hand lat]
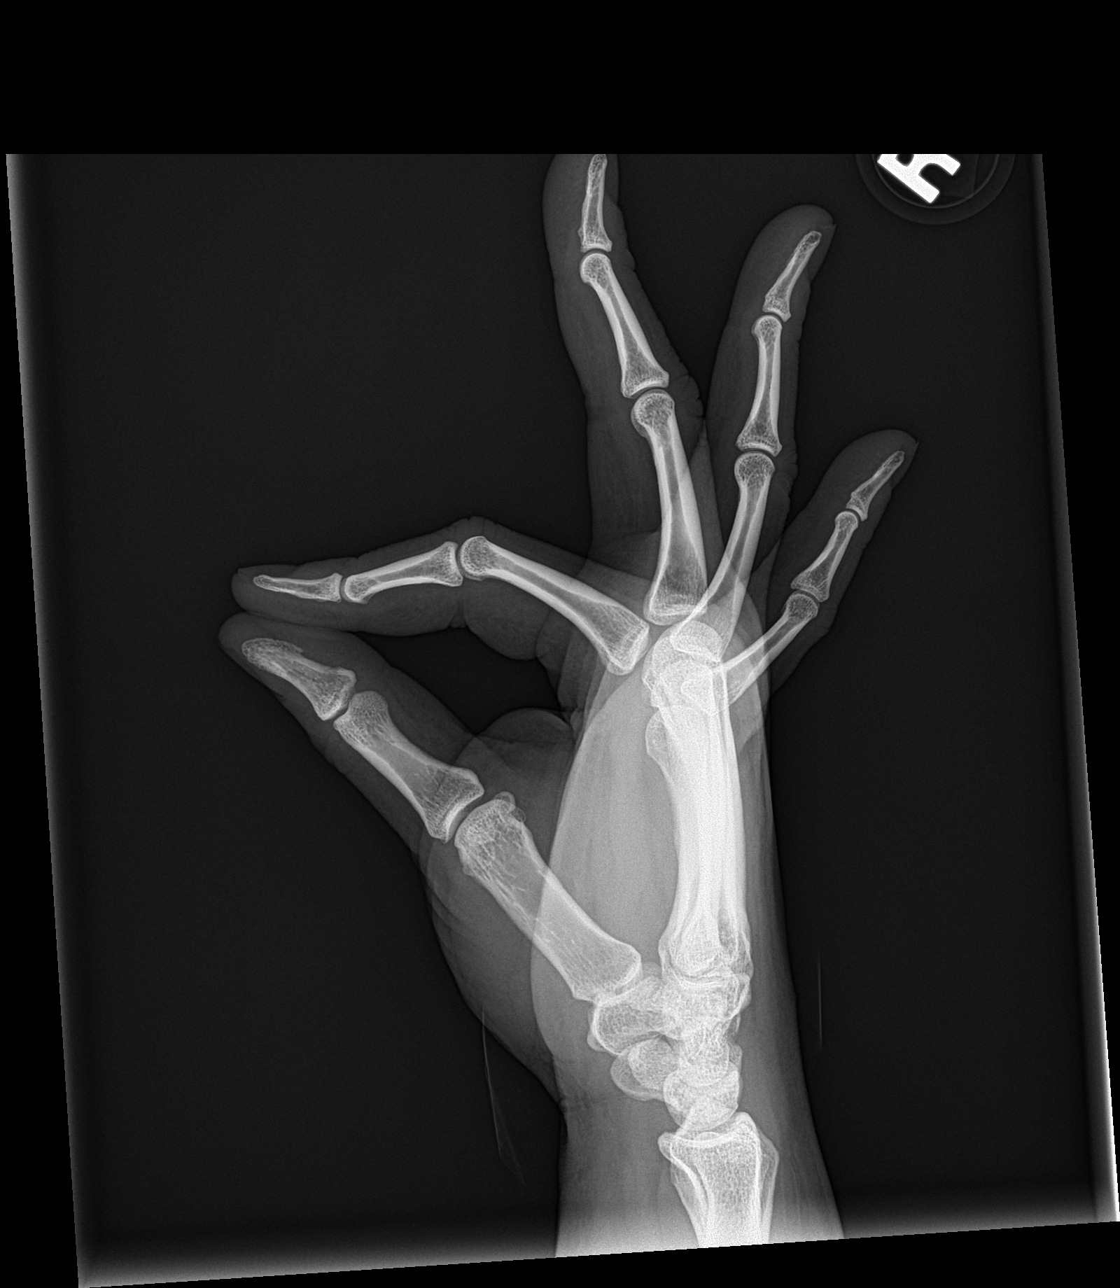

[3 of 3 positions shown; findings below may reference images not displayed]

FINDINGS: There is no evidence of fracture or dislocation. There is no
evidence of arthropathy or other focal bone abnormality. Soft
tissues are unremarkable.
IMPRESSION: Negative.

## 2022-12-25 ENCOUNTER — Ambulatory Visit (HOSPITAL_COMMUNITY): Payer: MEDICAID | Admitting: Licensed Clinical Social Worker

## 2023-07-13 ENCOUNTER — Emergency Department (HOSPITAL_COMMUNITY)

## 2023-07-13 ENCOUNTER — Emergency Department (HOSPITAL_COMMUNITY)
Admission: EM | Admit: 2023-07-13 | Discharge: 2023-07-13 | Disposition: A | Discharge status: Home / Self Care | Attending: Emergency Medicine | Admitting: Emergency Medicine

## 2023-07-13 ENCOUNTER — Other Ambulatory Visit: Payer: Self-pay

## 2023-07-13 ENCOUNTER — Encounter (HOSPITAL_COMMUNITY): Payer: Self-pay

## 2023-07-13 DIAGNOSIS — R102 Pelvic and perineal pain: Secondary | ICD-10-CM | POA: Diagnosis not present

## 2023-07-13 DIAGNOSIS — N939 Abnormal uterine and vaginal bleeding, unspecified: Secondary | ICD-10-CM | POA: Diagnosis not present

## 2023-07-13 DIAGNOSIS — T7621XA Adult sexual abuse, suspected, initial encounter: Secondary | ICD-10-CM | POA: Diagnosis present

## 2023-07-13 DIAGNOSIS — T7421XA Adult sexual abuse, confirmed, initial encounter: Secondary | ICD-10-CM

## 2023-07-13 HISTORY — DX: Type 2 diabetes mellitus without complications: E11.9

## 2023-07-13 LAB — COMPREHENSIVE METABOLIC PANEL WITH GFR
ALT: 20 U/L (ref 0–44)
AST: 19 U/L (ref 15–41)
Albumin: 3.5 g/dL (ref 3.5–5.0)
Alkaline Phosphatase: 62 U/L (ref 38–126)
Anion gap: 9 (ref 5–15)
BUN: 6 mg/dL (ref 6–20)
CO2: 26 mmol/L (ref 22–32)
Calcium: 9.1 mg/dL (ref 8.9–10.3)
Chloride: 104 mmol/L (ref 98–111)
Creatinine, Ser: 0.65 mg/dL (ref 0.44–1.00)
GFR, Estimated: >60 mL/min (ref >60)
Glucose, Bld: 102 mg/dL — ABNORMAL HIGH (ref 70–99)
Potassium: 3.6 mmol/L (ref 3.5–5.1)
Sodium: 139 mmol/L (ref 135–145)
Total Bilirubin: 0.2 mg/dL (ref 0.0–1.2)
Total Protein: 6.7 g/dL (ref 6.5–8.1)

## 2023-07-13 LAB — CBC WITH DIFFERENTIAL/PLATELET
Abs Immature Granulocytes: 0.01 10*3/uL (ref 0.00–0.07)
Basophils Absolute: 0.1 10*3/uL (ref 0.0–0.1)
Basophils Relative: 1 %
Eosinophils Absolute: 0.1 10*3/uL (ref 0.0–0.5)
Eosinophils Relative: 1 %
HCT: 37.3 % (ref 36.0–46.0)
Hemoglobin: 12.2 g/dL (ref 12.0–15.0)
Immature Granulocytes: 0 %
Lymphocytes Relative: 38 %
Lymphs Abs: 2.7 10*3/uL (ref 0.7–4.0)
MCH: 28 pg (ref 26.0–34.0)
MCHC: 32.7 g/dL (ref 30.0–36.0)
MCV: 85.7 fL (ref 80.0–100.0)
Monocytes Absolute: 0.6 10*3/uL (ref 0.1–1.0)
Monocytes Relative: 8 %
Neutro Abs: 3.7 10*3/uL (ref 1.7–7.7)
Neutrophils Relative %: 52 %
Platelets: 402 10*3/uL — ABNORMAL HIGH (ref 150–400)
RBC: 4.35 MIL/uL (ref 3.87–5.11)
RDW: 13.2 % (ref 11.5–15.5)
WBC: 7.1 10*3/uL (ref 4.0–10.5)
nRBC: 0 % (ref 0.0–0.2)

## 2023-07-13 LAB — PREGNANCY, URINE: Preg Test, Ur: NEGATIVE

## 2023-07-13 MED ORDER — ACETAMINOPHEN 500 MG PO TABS
1000.0000 mg | ORAL_TABLET | Freq: Once | ORAL | Status: AC
  Administered 2023-07-13: 1000 mg via ORAL
  Filled 2023-07-13: qty 2
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
## 2023-12-03 ENCOUNTER — Encounter (INDEPENDENT_AMBULATORY_CARE_PROVIDER_SITE_OTHER): Payer: Self-pay | Admitting: Physician Assistant

## 2023-12-03 ENCOUNTER — Ambulatory Visit (INDEPENDENT_AMBULATORY_CARE_PROVIDER_SITE_OTHER): Payer: MEDICAID | Admitting: Physician Assistant

## 2023-12-03 VITALS — BP 133/90 | HR 74

## 2023-12-03 DIAGNOSIS — H6123 Impacted cerumen, bilateral: Secondary | ICD-10-CM

## 2023-12-03 DIAGNOSIS — H60501 Unspecified acute noninfective otitis externa, right ear: Secondary | ICD-10-CM | POA: Diagnosis not present

## 2023-12-03 MED ORDER — OFLOXACIN 0.3 % OT SOLN: 5.0000 [drp] | Freq: Every day | OTIC | 0 refills | Status: AC

## 2023-12-03 NOTE — Progress Notes (Unsigned)
 Dear Dr. Alvia, Here is my assessment for our mutual patient, Allison Brown. Thank you for allowing me the opportunity to care for your patient. Please do not hesitate to contact me should you have any other questions. Sincerely, Chyrl Cohen PA-C  Otolaryngology Clinic Note Referring provider: Dr. Alvia HPI:  Allison Brown is a 43 y.o. female kindly referred by Dr. Alvia   The patient is a 43 year old female seen in our office for evaluation of cerumen impaction.  The patient notes that she has had some discomfort and drainage from her right ear as well as some cerumen impaction.  She notes decreased hearing out of bilateral ears.  She notes she has had this prior.  She notes [facility] went to [facility], they noted cerumen impaction and referred to our office for evaluation.  Patient denies any drainage today, no significant pain, she notes decreased hearing bilateral.  She notes normally her hearing is good without issues.  She denies any trauma to the ear.   Independent Review of Additional Tests or Records:  [facility] referral November 26, 2023   PMH/Meds/All/SocHx/FamHx/ROS:   Past Medical History:  Diagnosis Date   Cocaine abuse (HCC)    Diabetes mellitus without complication (HCC)    ETOH abuse    Hypertension    Marijuana abuse      Past Surgical History:  Procedure Laterality Date   CESAREAN SECTION      History reviewed. No pertinent family history.   Social Connections: Unknown (08/29/2021)   Received from [facility]   Social Network    Social Network: Not on file      Current Outpatient Medications:    atorvastatin  (LIPITOR) 10 MG tablet, Take 1 tablet (10 mg total) by mouth daily., Disp: 30 tablet, Rfl: 0   FLUoxetine  (PROZAC ) 20 MG capsule, Take 1 capsule (20 mg total) by mouth daily., Disp: 30 capsule, Rfl: 2   hydrOXYzine  (ATARAX /VISTARIL ) 10 MG tablet, Take 1 tablet (10 mg total) by mouth 3 (three) times daily as needed  for anxiety., Disp: 90 tablet, Rfl: 2   lisinopril -hydrochlorothiazide  (ZESTORETIC ) 10-12.5 MG tablet, Take 1 tablet by mouth daily., Disp: 30 tablet, Rfl: 0   medroxyPROGESTERone  (DEPO-PROVERA ) 150 MG/ML injection, Inject 1 mL (150 mg total) into the muscle every 3 (three) months., Disp: 1 mL, Rfl: 3   metFORMIN  (GLUCOPHAGE -XR) 500 MG 24 hr tablet, Take 1 tablet by mouth once daily with breakfast, Disp: 30 tablet, Rfl: 0   Physical Exam:   BP (!) 133/90   Pulse 74   SpO2 99%   Pertinent Findings  CN II-XII intact Bilateral cerumen impaction Weber 512: equal Rinne 512: AC > BC b/l  Anterior rhinoscopy: Septum midline; bilateral inferior turbinates with no hypertrophy No lesions of oral cavity/oropharynx; dentition in normal limits No obviously palpable neck masses/lymphadenopathy/thyromegaly No respiratory distress or stridor  Seprately Identifiable Procedures:  Procedure: Bilateral ear microscopy and cerumen removal using microscope (CPT [phone]) - Mod 50 Pre-procedure diagnosis: bilateral cerumen impaction external auditory canals Post-procedure diagnosis: same Indication: bilateral cerumen impaction; given patient's otologic complaints and history as well as for improved and comprehensive examination of external ear and tympanic membrane, bilateral otologic examination using microscope was performed and impacted cerumen removed  Procedure: Patient was placed semi-recumbent. Both ear canals were examined using the microscope with findings above. Cerumen removed from bilateral external auditory canals using suction and currette with improvement in EAC examination and patency. Left: EAC was patent. TM was intact . Middle  ear was aerated. Drainage: none Right: EAC was patent. TM was intact . Middle ear was aerated . Drainage:minimal  Patient tolerated the procedure well.   Impression & Plans:  Allison Brown is a 43 y.o. female with the following   Cerumen impaction-  Cerumen  removed bilateral without difficulty.  She tolerated this well.  She did have some minimal drainage and signs of otitis externa on the right, this is mild.  She will use ofloxacin  for 5 days, if her symptoms persist or she develops any new or worsening signs or symptoms she will return immediately.  Her hearing is at baseline.  She verbalized understanding and agreement to today's plan had no further questions or concerns.   - f/u PRN   Thank you for allowing me the opportunity to care for your patient. Please do not hesitate to contact me should you have any other questions.  Sincerely, Chyrl Cohen PA-C [facility] Phone: [phone] Fax: [phone]  12/03/2023, 10:47 AM

## 2023-12-31 ENCOUNTER — Telehealth (INDEPENDENT_AMBULATORY_CARE_PROVIDER_SITE_OTHER): Payer: Self-pay | Admitting: Physician Assistant

## 2023-12-31 MED ORDER — OFLOXACIN 0.3 % OT SOLN: 5.0000 [drp] | Freq: Every day | OTIC | 0 refills | Status: AC

## 2023-12-31 NOTE — Telephone Encounter (Signed)
 Allison Brown called reporting she was seen by her audiologist and was told she had otitis externa.  She has had this previous as documented in my last office visit note.  She notes some minor drainage this morning.  I do think it is reasonable to initiate otic drops, would like to see her back in the office in 1 to 2 weeks for repeat evaluation.  The patient verbalized understanding and agreement to today's plan.  Strict return precautions given.

## 2024-01-01 ENCOUNTER — Encounter (INDEPENDENT_AMBULATORY_CARE_PROVIDER_SITE_OTHER): Payer: Self-pay

## 2024-01-08 ENCOUNTER — Encounter (INDEPENDENT_AMBULATORY_CARE_PROVIDER_SITE_OTHER): Payer: Self-pay | Admitting: Physician Assistant

## 2024-01-08 ENCOUNTER — Ambulatory Visit (INDEPENDENT_AMBULATORY_CARE_PROVIDER_SITE_OTHER): Payer: MEDICAID | Admitting: Physician Assistant

## 2024-01-08 VITALS — BP 125/84 | HR 75

## 2024-01-08 DIAGNOSIS — B369 Superficial mycosis, unspecified: Secondary | ICD-10-CM

## 2024-01-08 MED ORDER — CLOTRIMAZOLE 1 % EX SOLN: 1.0000 | Freq: Two times a day (BID) | CUTANEOUS | 0 refills | Status: AC

## 2024-01-08 NOTE — Progress Notes (Signed)
 Dear Dr. Rexford ref. provider found, Here is my assessment for our mutual patient, Allison Brown. Thank you for allowing me the opportunity to care for your patient. Please do not hesitate to contact me should you have any other questions. Sincerely, Chyrl Cohen PA-C  Otolaryngology Clinic Note Referring provider: Dr. Rexford ref. provider found HPI:  Gretta Malachi is a 43 y.o. female kindly referred by Dr. No ref. provider found   The patient is a 43 year old female seen in our office for follow-up evaluation of otitis externa.  The patient was last seen in the office on 12/03/2023.  At that time she was seen for cerumen impaction.  He had some findings of otitis externa on the right that was mild.  She was treated with ofloxacin .  She notes she did very well afterwards with no significant issues or symptoms.  She went to see her audiologist and was told that she had otitis externa on the right, she called our office on 12/31/2023.  I did call and otic drops given her previous history of the same.  She notes she has not started them but denies any pain, decreased hearing, drainage, or any signs of infection.  She notes she is a diabetic but fairly well-controlled.   Independent Review of Additional Tests or Records:  None   PMH/Meds/All/SocHx/FamHx/ROS:   Past Medical History:  Diagnosis Date   Cocaine abuse (HCC)    Diabetes mellitus without complication (HCC)    ETOH abuse    Hypertension    Marijuana abuse      Past Surgical History:  Procedure Laterality Date   CESAREAN SECTION      History reviewed. No pertinent family history.   Social Connections: Unknown (08/29/2021)   Received from [facility]   Social Network    Social Network: Not on file      Current Outpatient Medications:    clotrimazole  (LOTRIMIN ) 1 % external solution, Apply 1 Application topically 2 (two) times daily. Right ear only, Disp: 30 mL, Rfl: 0   semaglutide-weight management (WEGOVY) 0.25 MG/0.5ML  SOAJ SQ injection, Inject 0.25 mg into the skin., Disp: , Rfl:    atorvastatin  (LIPITOR) 10 MG tablet, Take 1 tablet (10 mg total) by mouth daily., Disp: 30 tablet, Rfl: 0   FLUoxetine  (PROZAC ) 20 MG capsule, Take 1 capsule (20 mg total) by mouth daily., Disp: 30 capsule, Rfl: 2   hydrOXYzine  (ATARAX /VISTARIL ) 10 MG tablet, Take 1 tablet (10 mg total) by mouth 3 (three) times daily as needed for anxiety., Disp: 90 tablet, Rfl: 2   lisinopril -hydrochlorothiazide  (ZESTORETIC ) 10-12.5 MG tablet, Take 1 tablet by mouth daily., Disp: 30 tablet, Rfl: 0   medroxyPROGESTERone  (DEPO-PROVERA ) 150 MG/ML injection, Inject 1 mL (150 mg total) into the muscle every 3 (three) months., Disp: 1 mL, Rfl: 3   metFORMIN  (GLUCOPHAGE -XR) 500 MG 24 hr tablet, Take 1 tablet by mouth once daily with breakfast, Disp: 30 tablet, Rfl: 0   Physical Exam:   BP 125/84 (BP Location: Right Arm)   Pulse 75   SpO2 98%   Pertinent Findings  CN II-XII intact Right external auditory canal with yellow fungal spores, no significant drainage, no erythema, right TM intact with cerumen overlying.  Left external auditory canal clear, TM intact with well-pneumatized middle  No obviously palpable neck masses/lymphadenopathy/thyromegaly No respiratory distress or stridor  Seprately Identifiable Procedures:  None  Impression & Plans:  Maris Buckbee is a 43 y.o. female with the following   Otomycosis-  Right-sided otomycosis  this is mild.  Will treat with clotrimazole  otic solution.  She will use this for 1 week, she will follow-up in our office in 2 weeks for repeat evaluation or sooner as needed.  She verbalized understanding and agreement to today's plan.    - f/u 2-week follow-up   Thank you for allowing me the opportunity to care for your patient. Please do not hesitate to contact me should you have any other questions.  Sincerely, Chyrl Cohen PA-C [facility] Phone: [phone] Fax:  [phone]  01/08/2024, 8:46 AM

## 2024-01-10 ENCOUNTER — Telehealth (INDEPENDENT_AMBULATORY_CARE_PROVIDER_SITE_OTHER): Payer: Self-pay | Admitting: Physician Assistant

## 2024-01-10 ENCOUNTER — Telehealth (INDEPENDENT_AMBULATORY_CARE_PROVIDER_SITE_OTHER): Payer: Self-pay

## 2024-01-10 MED ORDER — ACETIC ACID 2 % OT SOLN: 4.0000 [drp] | Freq: Three times a day (TID) | OTIC | 0 refills | Status: AC

## 2024-01-10 NOTE — Telephone Encounter (Signed)
 Thank you, [name] will reach out to her.  Her pharmacy wants preauthorization of the medication that was sent, this could take some time, I did send in acetic acid  for her to use.  If she would like to continue attempting getting preauthorization the pharmacy will send in the information for us .

## 2024-01-10 NOTE — Addendum Note (Signed)
 Addended byBETHA COHEN, Oreta Soloway on: 01/10/2024 09:49 AM   Modules accepted: Orders

## 2024-01-10 NOTE — Telephone Encounter (Signed)
 LVM told PT to give me a call back.

## 2024-01-10 NOTE — Telephone Encounter (Signed)
 Pt called about ear drops being sent in

## 2024-01-13 ENCOUNTER — Telehealth (INDEPENDENT_AMBULATORY_CARE_PROVIDER_SITE_OTHER): Payer: Self-pay

## 2024-01-13 ENCOUNTER — Other Ambulatory Visit (INDEPENDENT_AMBULATORY_CARE_PROVIDER_SITE_OTHER): Payer: Self-pay

## 2024-01-13 ENCOUNTER — Encounter (INDEPENDENT_AMBULATORY_CARE_PROVIDER_SITE_OTHER): Payer: Self-pay

## 2024-01-13 NOTE — Telephone Encounter (Signed)
 I tried to call pt tp inform her that we got prior approval for medication Clotrimazole  1% solu. There was no answer and full mail box, I could not leave a message.

## 2024-01-22 ENCOUNTER — Ambulatory Visit (INDEPENDENT_AMBULATORY_CARE_PROVIDER_SITE_OTHER): Payer: MEDICAID | Admitting: Physician Assistant

## 2024-01-22 ENCOUNTER — Encounter (INDEPENDENT_AMBULATORY_CARE_PROVIDER_SITE_OTHER): Payer: Self-pay | Admitting: Physician Assistant

## 2024-01-22 VITALS — BP 149/93 | HR 71 | Temp 98.5°F

## 2024-01-22 DIAGNOSIS — H60501 Unspecified acute noninfective otitis externa, right ear: Secondary | ICD-10-CM

## 2024-01-22 NOTE — Progress Notes (Signed)
 Patient is having BP managed.

## 2024-01-22 NOTE — Progress Notes (Signed)
 Dear Dr. Rexford ref. provider found, Here is my assessment for our mutual patient, Allison Brown. Thank you for allowing me the opportunity to care for your patient. Please do not hesitate to contact me should you have any other questions. Sincerely, Allison Cohen PA-C  Otolaryngology Clinic Note Referring provider: Dr. Rexford ref. provider found HPI:  Allison Brown is a 43 y.o. female kindly referred by Dr. No ref. provider found   The patient is a 43 year old female seen in our office for follow-up evaluation of otitis externa.  She was last seen in the office on 01/08/2024.  Below is a recap of the encounter.   The patient is a 43 year old female seen in our office for follow-up evaluation of otitis externa.  The patient was last seen in the office on 12/03/2023.  At that time she was seen for cerumen impaction.  He had some findings of otitis externa on the right that was mild.  She was treated with ofloxacin .  She notes she did very well afterwards with no significant issues or symptoms.  She went to see her audiologist and was told that she had otitis externa on the right, she called our office on 12/31/2023.  I did call and otic drops given her previous history of the same.  She notes she has not started them but denies any pain, decreased hearing, drainage, or any signs of infection.  She notes she is a diabetic but fairly well-controlled.   Update 01/22/2024  Since her last office visit she notes she has been doing well, she notes some minimal drainage out of the right ear, no pain.  She notes some decreased hearing out of the right ear.      Independent Review of Additional Tests or Records:     PMH/Meds/All/SocHx/FamHx/ROS:   Past Medical History:  Diagnosis Date   Cocaine abuse (HCC)    Diabetes mellitus without complication (HCC)    ETOH abuse    Hypertension    Marijuana abuse      Past Surgical History:  Procedure Laterality Date   CESAREAN SECTION      History  reviewed. No pertinent family history.   Social Connections: Unknown (08/29/2021)   Received from [facility]   Social Network    Social Network: Not on file      Current Outpatient Medications:    acetic acid  2 % otic solution, Place 4 drops into the right ear 3 (three) times daily., Disp: 15 mL, Rfl: 0   atorvastatin  (LIPITOR) 10 MG tablet, Take 1 tablet (10 mg total) by mouth daily., Disp: 30 tablet, Rfl: 0   clotrimazole  (LOTRIMIN ) 1 % external solution, Apply 1 Application topically 2 (two) times daily. Right ear only, Disp: 30 mL, Rfl: 0   FLUoxetine  (PROZAC ) 20 MG capsule, Take 1 capsule (20 mg total) by mouth daily., Disp: 30 capsule, Rfl: 2   hydrOXYzine  (ATARAX /VISTARIL ) 10 MG tablet, Take 1 tablet (10 mg total) by mouth 3 (three) times daily as needed for anxiety., Disp: 90 tablet, Rfl: 2   lisinopril -hydrochlorothiazide  (ZESTORETIC ) 10-12.5 MG tablet, Take 1 tablet by mouth daily., Disp: 30 tablet, Rfl: 0   medroxyPROGESTERone  (DEPO-PROVERA ) 150 MG/ML injection, Inject 1 mL (150 mg total) into the muscle every 3 (three) months., Disp: 1 mL, Rfl: 3   metFORMIN  (GLUCOPHAGE -XR) 500 MG 24 hr tablet, Take 1 tablet by mouth once daily with breakfast, Disp: 30 tablet, Rfl: 0   semaglutide-weight management (WEGOVY) 0.25 MG/0.5ML SOAJ SQ injection, Inject 0.25 mg  into the skin., Disp: , Rfl:    Physical Exam:   BP (!) 149/93   Pulse 71   Temp 98.5 F (36.9 C)   SpO2 98%   Pertinent Findings  CN II-XII intact Right external auditory canal with no significant erythema or edema, minimal drainage, TM intact with well-pneumatized middle ear space, left EAC clear, TM intact with well-pneumatized middle ear space No obvious  neck masses/lymphadenopathy/thyromegaly No respiratory distress or stridor  Seprately Identifiable Procedures:  None  Impression & Plans:  Allison Brown is a 43 y.o. female with the following   Otitis externa-  Patient appears to be doing fairly well, she  still has some drainage from the right ear.  I would recommend cash powder at this point, prescription will be sent.  I like to see the patient back in the office 2 weeks after using the powder, sooner as needed.  She verbalized understanding and agreement to today's plan.   - f/u 2-week office visit   Thank you for allowing me the opportunity to care for your patient. Please do not hesitate to contact me should you have any other questions.  Sincerely, Allison Cohen PA-C [facility] Phone: [phone] Fax: [phone]  01/22/2024, 9:59 AM

## 2024-01-31 ENCOUNTER — Telehealth (INDEPENDENT_AMBULATORY_CARE_PROVIDER_SITE_OTHER): Payer: Self-pay

## 2024-02-05 ENCOUNTER — Ambulatory Visit (INDEPENDENT_AMBULATORY_CARE_PROVIDER_SITE_OTHER): Admitting: Physician Assistant
